# Patient Record
Sex: Female | Born: 1935 | Race: White | Hispanic: No | Smoking: Never smoker
Health system: Southern US, Community
[De-identification: ages and names within clinical notes are randomized; demographics above are authoritative.]

## PROBLEM LIST (undated history)

## (undated) DIAGNOSIS — R269 Unspecified abnormalities of gait and mobility: Secondary | ICD-10-CM

## (undated) DIAGNOSIS — C9110 Chronic lymphocytic leukemia of B-cell type not having achieved remission: Secondary | ICD-10-CM

## (undated) DIAGNOSIS — G629 Polyneuropathy, unspecified: Secondary | ICD-10-CM

## (undated) DIAGNOSIS — G20 Parkinson's disease: Secondary | ICD-10-CM

## (undated) DIAGNOSIS — E119 Type 2 diabetes mellitus without complications: Secondary | ICD-10-CM

## (undated) DIAGNOSIS — I10 Essential (primary) hypertension: Secondary | ICD-10-CM

## (undated) DIAGNOSIS — M48061 Spinal stenosis, lumbar region without neurogenic claudication: Secondary | ICD-10-CM

## (undated) HISTORY — PX: APPENDECTOMY: SHX54

---

## 1997-05-02 ENCOUNTER — Ambulatory Visit (HOSPITAL_COMMUNITY): Admission: RE | Admit: 1997-05-02 | Discharge: 1997-05-02 | Payer: Self-pay | Admitting: Family Medicine

## 1997-07-26 ENCOUNTER — Other Ambulatory Visit: Admission: RE | Admit: 1997-07-26 | Discharge: 1997-07-26 | Payer: Self-pay | Admitting: Critical Care Medicine

## 1998-02-24 ENCOUNTER — Ambulatory Visit (HOSPITAL_COMMUNITY): Admission: RE | Admit: 1998-02-24 | Discharge: 1998-02-24 | Payer: Self-pay | Admitting: Neurosurgery

## 1998-02-24 ENCOUNTER — Encounter: Payer: Self-pay | Admitting: Neurosurgery

## 1998-10-26 ENCOUNTER — Encounter: Admission: RE | Admit: 1998-10-26 | Discharge: 1999-01-24 | Payer: Self-pay | Admitting: Anesthesiology

## 1999-01-12 ENCOUNTER — Encounter: Payer: Self-pay | Admitting: Family Medicine

## 1999-01-12 ENCOUNTER — Ambulatory Visit (HOSPITAL_COMMUNITY): Admission: RE | Admit: 1999-01-12 | Discharge: 1999-01-12 | Payer: Self-pay | Admitting: Obstetrics and Gynecology

## 1999-01-23 ENCOUNTER — Encounter: Admission: RE | Admit: 1999-01-23 | Discharge: 1999-01-23 | Payer: Self-pay | Admitting: Oncology

## 1999-01-23 ENCOUNTER — Encounter: Payer: Self-pay | Admitting: Oncology

## 1999-02-05 ENCOUNTER — Ambulatory Visit (HOSPITAL_COMMUNITY): Admission: RE | Admit: 1999-02-05 | Discharge: 1999-02-05 | Payer: Self-pay | Admitting: Oncology

## 1999-02-05 ENCOUNTER — Encounter: Admission: RE | Admit: 1999-02-05 | Discharge: 1999-05-06 | Payer: Self-pay | Admitting: Anesthesiology

## 1999-04-08 ENCOUNTER — Encounter: Payer: Self-pay | Admitting: Neurosurgery

## 1999-04-08 ENCOUNTER — Ambulatory Visit (HOSPITAL_COMMUNITY): Admission: RE | Admit: 1999-04-08 | Discharge: 1999-04-08 | Payer: Self-pay | Admitting: Neurosurgery

## 1999-05-14 ENCOUNTER — Encounter: Admission: RE | Admit: 1999-05-14 | Discharge: 1999-08-12 | Payer: Self-pay | Admitting: Anesthesiology

## 2000-02-04 ENCOUNTER — Other Ambulatory Visit: Admission: RE | Admit: 2000-02-04 | Discharge: 2000-02-04 | Payer: Self-pay | Admitting: Family Medicine

## 2001-02-18 HISTORY — PX: COLONOSCOPY: SHX174

## 2002-01-11 ENCOUNTER — Ambulatory Visit (HOSPITAL_COMMUNITY): Admission: RE | Admit: 2002-01-11 | Discharge: 2002-01-11 | Payer: Self-pay | Admitting: Gastroenterology

## 2002-09-20 ENCOUNTER — Inpatient Hospital Stay (HOSPITAL_COMMUNITY): Admission: AD | Admit: 2002-09-20 | Discharge: 2002-09-25 | Payer: Self-pay | Admitting: Cardiology

## 2002-09-20 ENCOUNTER — Encounter: Payer: Self-pay | Admitting: Cardiology

## 2002-09-21 ENCOUNTER — Encounter: Payer: Self-pay | Admitting: Cardiology

## 2002-09-23 ENCOUNTER — Encounter (INDEPENDENT_AMBULATORY_CARE_PROVIDER_SITE_OTHER): Payer: Self-pay | Admitting: *Deleted

## 2003-05-03 ENCOUNTER — Other Ambulatory Visit: Admission: RE | Admit: 2003-05-03 | Discharge: 2003-05-03 | Payer: Self-pay | Admitting: Obstetrics and Gynecology

## 2003-07-20 HISTORY — PX: CHOLECYSTECTOMY: SHX55

## 2003-08-16 ENCOUNTER — Encounter (INDEPENDENT_AMBULATORY_CARE_PROVIDER_SITE_OTHER): Payer: Self-pay | Admitting: Specialist

## 2003-08-16 ENCOUNTER — Inpatient Hospital Stay (HOSPITAL_COMMUNITY): Admission: EM | Admit: 2003-08-16 | Discharge: 2003-08-19 | Payer: Self-pay | Admitting: Emergency Medicine

## 2003-08-16 HISTORY — PX: BONE MARROW BIOPSY: SHX1253

## 2003-12-07 ENCOUNTER — Ambulatory Visit (HOSPITAL_COMMUNITY): Admission: RE | Admit: 2003-12-07 | Discharge: 2003-12-07 | Payer: Self-pay | Admitting: Oncology

## 2003-12-22 ENCOUNTER — Ambulatory Visit: Payer: Self-pay | Admitting: Oncology

## 2004-02-07 ENCOUNTER — Ambulatory Visit: Payer: Self-pay | Admitting: Oncology

## 2004-08-06 ENCOUNTER — Inpatient Hospital Stay (HOSPITAL_COMMUNITY): Admission: EM | Admit: 2004-08-06 | Discharge: 2004-08-08 | Payer: Self-pay | Admitting: Emergency Medicine

## 2004-08-27 ENCOUNTER — Ambulatory Visit: Payer: Self-pay | Admitting: Oncology

## 2004-09-14 ENCOUNTER — Ambulatory Visit: Payer: Self-pay | Admitting: Critical Care Medicine

## 2004-10-04 ENCOUNTER — Ambulatory Visit: Payer: Self-pay | Admitting: Critical Care Medicine

## 2004-11-29 ENCOUNTER — Ambulatory Visit: Payer: Self-pay | Admitting: Critical Care Medicine

## 2005-01-29 ENCOUNTER — Ambulatory Visit: Payer: Self-pay | Admitting: Critical Care Medicine

## 2005-02-25 ENCOUNTER — Ambulatory Visit: Payer: Self-pay | Admitting: Oncology

## 2005-06-19 ENCOUNTER — Ambulatory Visit: Payer: Self-pay | Admitting: Critical Care Medicine

## 2005-08-19 ENCOUNTER — Ambulatory Visit: Payer: Self-pay | Admitting: Oncology

## 2005-08-27 LAB — CBC WITH DIFFERENTIAL/PLATELET
Basophils Absolute: 0 10*3/uL (ref 0.0–0.1)
HCT: 39.3 % (ref 34.8–46.6)
HGB: 13.7 g/dL (ref 11.6–15.9)
MONO#: 0.5 10*3/uL (ref 0.1–0.9)
NEUT#: 3.9 10*3/uL (ref 1.5–6.5)
NEUT%: 72.4 % (ref 39.6–76.8)
WBC: 5.4 10*3/uL (ref 3.9–10.0)
lymph#: 0.8 10*3/uL — ABNORMAL LOW (ref 0.9–3.3)

## 2005-08-29 LAB — COMPREHENSIVE METABOLIC PANEL
ALT: 14 U/L (ref 0–40)
Albumin: 4 g/dL (ref 3.5–5.2)
BUN: 16 mg/dL (ref 6–23)
CO2: 27 mEq/L (ref 19–32)
Calcium: 9 mg/dL (ref 8.4–10.5)
Chloride: 106 mEq/L (ref 96–112)
Creatinine, Ser: 0.79 mg/dL (ref 0.40–1.20)

## 2005-08-29 LAB — IMMUNOFIXATION ELECTROPHORESIS
IgA: 220 mg/dL (ref 68–378)
IgG (Immunoglobin G), Serum: 657 mg/dL — ABNORMAL LOW (ref 694–1618)

## 2005-08-29 LAB — BETA 2 MICROGLOBULIN, SERUM: Beta-2 Microglobulin: 2.55 mg/L — ABNORMAL HIGH (ref 1.01–1.73)

## 2005-09-24 LAB — CBC WITH DIFFERENTIAL/PLATELET
BASO%: 0.6 % (ref 0.0–2.0)
EOS%: 3.2 % (ref 0.0–7.0)
HGB: 13.7 g/dL (ref 11.6–15.9)
MCH: 32.2 pg (ref 26.0–34.0)
MCHC: 35.6 g/dL (ref 32.0–36.0)
MCV: 90.6 fL (ref 81.0–101.0)
MONO%: 10.3 % (ref 0.0–13.0)
RBC: 4.26 10*6/uL (ref 3.70–5.32)
RDW: 12.5 % (ref 11.3–14.5)
lymph#: 1.1 10*3/uL (ref 0.9–3.3)

## 2005-10-01 LAB — CBC WITH DIFFERENTIAL/PLATELET
Basophils Absolute: 0 10*3/uL (ref 0.0–0.1)
Eosinophils Absolute: 0.2 10*3/uL (ref 0.0–0.5)
HGB: 14.7 g/dL (ref 11.6–15.9)
MCV: 91.5 fL (ref 81.0–101.0)
MONO%: 10 % (ref 0.0–13.0)
NEUT#: 4 10*3/uL (ref 1.5–6.5)
RBC: 4.56 10*6/uL (ref 3.70–5.32)
RDW: 12.6 % (ref 11.3–14.5)
WBC: 6 10*3/uL (ref 3.9–10.0)
lymph#: 1.2 10*3/uL (ref 0.9–3.3)

## 2005-10-06 ENCOUNTER — Ambulatory Visit: Payer: Self-pay | Admitting: Oncology

## 2005-10-08 LAB — CBC WITH DIFFERENTIAL/PLATELET
BASO%: 0.9 % (ref 0.0–2.0)
Basophils Absolute: 0.1 10*3/uL (ref 0.0–0.1)
EOS%: 3.4 % (ref 0.0–7.0)
Eosinophils Absolute: 0.2 10*3/uL (ref 0.0–0.5)
HCT: 40.4 % (ref 34.8–46.6)
HGB: 14 g/dL (ref 11.6–15.9)
LYMPH%: 15.8 % (ref 14.0–48.0)
MCH: 32.2 pg (ref 26.0–34.0)
MCHC: 34.7 g/dL (ref 32.0–36.0)
MCV: 92.8 fL (ref 81.0–101.0)
MONO#: 0.6 10*3/uL (ref 0.1–0.9)
MONO%: 10.5 % (ref 0.0–13.0)
NEUT#: 4 10*3/uL (ref 1.5–6.5)
NEUT%: 69.4 % (ref 39.6–76.8)
Platelets: 163 10*3/uL (ref 145–400)
RBC: 4.36 10*6/uL (ref 3.70–5.32)
RDW: 12.7 % (ref 11.3–14.5)
WBC: 5.8 10*3/uL (ref 3.9–10.0)
lymph#: 0.9 10*3/uL (ref 0.9–3.3)

## 2005-10-15 LAB — CBC WITH DIFFERENTIAL/PLATELET
BASO%: 0.7 % (ref 0.0–2.0)
HCT: 42 % (ref 34.8–46.6)
MCHC: 34.1 g/dL (ref 32.0–36.0)
MONO#: 0.6 10*3/uL (ref 0.1–0.9)
RBC: 4.58 10*6/uL (ref 3.70–5.32)
WBC: 6.5 10*3/uL (ref 3.9–10.0)
lymph#: 1.1 10*3/uL (ref 0.9–3.3)

## 2005-11-19 ENCOUNTER — Ambulatory Visit: Payer: Self-pay | Admitting: Critical Care Medicine

## 2005-12-02 ENCOUNTER — Ambulatory Visit: Payer: Self-pay | Admitting: Oncology

## 2006-02-05 ENCOUNTER — Emergency Department (HOSPITAL_COMMUNITY): Admission: EM | Admit: 2006-02-05 | Discharge: 2006-02-05 | Payer: Self-pay | Admitting: Emergency Medicine

## 2006-02-27 ENCOUNTER — Ambulatory Visit: Payer: Self-pay | Admitting: Oncology

## 2006-03-04 LAB — CBC WITH DIFFERENTIAL/PLATELET
Basophils Absolute: 0 10*3/uL (ref 0.0–0.1)
Eosinophils Absolute: 0.1 10*3/uL (ref 0.0–0.5)
HCT: 40.8 % (ref 34.8–46.6)
HGB: 13.9 g/dL (ref 11.6–15.9)
LYMPH%: 14.7 % (ref 14.0–48.0)
MCV: 93.3 fL (ref 81.0–101.0)
MONO#: 0.5 10*3/uL (ref 0.1–0.9)
MONO%: 8 % (ref 0.0–13.0)
NEUT#: 4.9 10*3/uL (ref 1.5–6.5)
Platelets: 179 10*3/uL (ref 145–400)
WBC: 6.5 10*3/uL (ref 3.9–10.0)

## 2006-03-05 LAB — COMPREHENSIVE METABOLIC PANEL
Albumin: 4.2 g/dL (ref 3.5–5.2)
Alkaline Phosphatase: 110 U/L (ref 39–117)
BUN: 16 mg/dL (ref 6–23)
CO2: 27 mEq/L (ref 19–32)
Glucose, Bld: 142 mg/dL — ABNORMAL HIGH (ref 70–99)
Total Bilirubin: 0.7 mg/dL (ref 0.3–1.2)
Total Protein: 6.4 g/dL (ref 6.0–8.3)

## 2006-03-05 LAB — IGG, IGA, IGM
IgA: 211 mg/dL (ref 68–378)
IgG (Immunoglobin G), Serum: 586 mg/dL — ABNORMAL LOW (ref 694–1618)
IgM, Serum: 18 mg/dL — ABNORMAL LOW (ref 60–263)

## 2006-04-07 ENCOUNTER — Ambulatory Visit: Payer: Self-pay | Admitting: Oncology

## 2006-07-08 ENCOUNTER — Ambulatory Visit: Payer: Self-pay | Admitting: Oncology

## 2006-07-10 LAB — CBC WITH DIFFERENTIAL/PLATELET
BASO%: 0.2 % (ref 0.0–2.0)
Eosinophils Absolute: 0.1 10*3/uL (ref 0.0–0.5)
LYMPH%: 13.8 % — ABNORMAL LOW (ref 14.0–48.0)
MCHC: 34.9 g/dL (ref 32.0–36.0)
MCV: 92.1 fL (ref 81.0–101.0)
MONO%: 7.3 % (ref 0.0–13.0)
NEUT#: 6 10*3/uL (ref 1.5–6.5)
Platelets: 186 10*3/uL (ref 145–400)
RBC: 4.36 10*6/uL (ref 3.70–5.32)
RDW: 13 % (ref 11.3–14.5)
WBC: 7.7 10*3/uL (ref 3.9–10.0)

## 2006-09-02 ENCOUNTER — Emergency Department (HOSPITAL_COMMUNITY): Admission: EM | Admit: 2006-09-02 | Discharge: 2006-09-02 | Payer: Self-pay | Admitting: Emergency Medicine

## 2006-09-29 ENCOUNTER — Ambulatory Visit: Payer: Self-pay | Admitting: Oncology

## 2006-10-02 LAB — CBC & DIFF AND RETIC
BASO%: 0.2 % (ref 0.0–2.0)
Eosinophils Absolute: 0.2 10*3/uL (ref 0.0–0.5)
HCT: 40.1 % (ref 34.8–46.6)
IRF: 0.34 — ABNORMAL HIGH (ref 0.130–0.330)
MCHC: 35.5 g/dL (ref 32.0–36.0)
MONO#: 0.7 10*3/uL (ref 0.1–0.9)
NEUT#: 4.9 10*3/uL (ref 1.5–6.5)
NEUT%: 69.3 % (ref 39.6–76.8)
Platelets: 195 10*3/uL (ref 145–400)
RBC: 4.39 10*6/uL (ref 3.70–5.32)
Retic %: 2 % (ref 0.4–2.3)
WBC: 7.1 10*3/uL (ref 3.9–10.0)
lymph#: 1.3 10*3/uL (ref 0.9–3.3)

## 2006-10-02 LAB — CHCC SMEAR

## 2006-10-03 LAB — BETA 2 MICROGLOBULIN, SERUM: Beta-2 Microglobulin: 2.73 mg/L — ABNORMAL HIGH (ref 1.01–1.73)

## 2007-04-07 ENCOUNTER — Ambulatory Visit: Payer: Self-pay | Admitting: Oncology

## 2007-04-09 ENCOUNTER — Encounter: Payer: Self-pay | Admitting: Critical Care Medicine

## 2007-04-09 LAB — CHCC SMEAR

## 2007-04-09 LAB — CBC WITH DIFFERENTIAL/PLATELET
Basophils Absolute: 0 10*3/uL (ref 0.0–0.1)
EOS%: 1.9 % (ref 0.0–7.0)
Eosinophils Absolute: 0.2 10*3/uL (ref 0.0–0.5)
HGB: 14.4 g/dL (ref 11.6–15.9)
NEUT#: 6.1 10*3/uL (ref 1.5–6.5)
RBC: 4.47 10*6/uL (ref 3.70–5.32)
RDW: 13.3 % (ref 11.3–14.5)
lymph#: 1.6 10*3/uL (ref 0.9–3.3)

## 2007-04-09 LAB — MORPHOLOGY

## 2007-04-14 LAB — COMPREHENSIVE METABOLIC PANEL
Alkaline Phosphatase: 130 U/L — ABNORMAL HIGH (ref 39–117)
CO2: 28 mEq/L (ref 19–32)
Creatinine, Ser: 0.78 mg/dL (ref 0.40–1.20)
Glucose, Bld: 183 mg/dL — ABNORMAL HIGH (ref 70–99)
Sodium: 141 mEq/L (ref 135–145)
Total Bilirubin: 0.8 mg/dL (ref 0.3–1.2)
Total Protein: 6.5 g/dL (ref 6.0–8.3)

## 2007-04-14 LAB — IMMUNOFIXATION ELECTROPHORESIS
IgG (Immunoglobin G), Serum: 720 mg/dL (ref 694–1618)
IgM, Serum: 18 mg/dL — ABNORMAL LOW (ref 60–263)
Total Protein, Serum Electrophoresis: 6.5 g/dL (ref 6.0–8.3)

## 2007-04-14 LAB — BETA 2 MICROGLOBULIN, SERUM: Beta-2 Microglobulin: 2.63 mg/L — ABNORMAL HIGH (ref 1.01–1.73)

## 2007-10-23 ENCOUNTER — Ambulatory Visit: Payer: Self-pay | Admitting: Oncology

## 2007-10-29 ENCOUNTER — Encounter: Payer: Self-pay | Admitting: Critical Care Medicine

## 2007-10-29 LAB — CBC WITH DIFFERENTIAL/PLATELET
Eosinophils Absolute: 0.1 10*3/uL (ref 0.0–0.5)
LYMPH%: 12 % — ABNORMAL LOW (ref 14.0–48.0)
MONO#: 0.4 10*3/uL (ref 0.1–0.9)
NEUT#: 5.6 10*3/uL (ref 1.5–6.5)
Platelets: 160 10*3/uL (ref 145–400)
RBC: 4.26 10*6/uL (ref 3.70–5.32)
WBC: 7 10*3/uL (ref 3.9–10.0)
lymph#: 0.8 10*3/uL — ABNORMAL LOW (ref 0.9–3.3)

## 2007-10-29 LAB — MORPHOLOGY: PLT EST: ADEQUATE

## 2007-10-29 LAB — CHCC SMEAR

## 2007-11-02 LAB — IMMUNOFIXATION ELECTROPHORESIS: IgM, Serum: 15 mg/dL — ABNORMAL LOW (ref 60–263)

## 2007-11-02 LAB — COMPREHENSIVE METABOLIC PANEL
ALT: 12 U/L (ref 0–35)
AST: 18 U/L (ref 0–37)
Alkaline Phosphatase: 101 U/L (ref 39–117)
Creatinine, Ser: 0.81 mg/dL (ref 0.40–1.20)
Sodium: 139 mEq/L (ref 135–145)
Total Bilirubin: 0.8 mg/dL (ref 0.3–1.2)
Total Protein: 6.2 g/dL (ref 6.0–8.3)

## 2007-11-02 LAB — BETA 2 MICROGLOBULIN, SERUM: Beta-2 Microglobulin: 2.11 mg/L — ABNORMAL HIGH (ref 1.01–1.73)

## 2008-10-25 ENCOUNTER — Ambulatory Visit: Payer: Self-pay | Admitting: Oncology

## 2008-11-30 ENCOUNTER — Ambulatory Visit: Payer: Self-pay | Admitting: Oncology

## 2008-12-05 ENCOUNTER — Encounter: Payer: Self-pay | Admitting: Critical Care Medicine

## 2008-12-05 LAB — CBC WITH DIFFERENTIAL/PLATELET
BASO%: 0.1 % (ref 0.0–2.0)
EOS%: 1.4 % (ref 0.0–7.0)
MCH: 33 pg (ref 25.1–34.0)
MCHC: 34.9 g/dL (ref 31.5–36.0)
RDW: 13.4 % (ref 11.2–14.5)
lymph#: 0.9 10*3/uL (ref 0.9–3.3)

## 2008-12-05 LAB — COMPREHENSIVE METABOLIC PANEL
AST: 24 U/L (ref 0–37)
Albumin: 3.6 g/dL (ref 3.5–5.2)
Alkaline Phosphatase: 82 U/L (ref 39–117)
BUN: 13 mg/dL (ref 6–23)
Potassium: 4.4 mEq/L (ref 3.5–5.3)
Sodium: 138 mEq/L (ref 135–145)
Total Protein: 6.3 g/dL (ref 6.0–8.3)

## 2008-12-07 LAB — IMMUNOFIXATION ELECTROPHORESIS: IgA: 282 mg/dL (ref 68–378)

## 2009-02-16 ENCOUNTER — Encounter: Admission: RE | Admit: 2009-02-16 | Discharge: 2009-02-16 | Payer: Self-pay | Admitting: Gastroenterology

## 2009-02-21 ENCOUNTER — Encounter: Admission: RE | Admit: 2009-02-21 | Discharge: 2009-02-21 | Payer: Self-pay | Admitting: Gastroenterology

## 2009-07-15 ENCOUNTER — Emergency Department (HOSPITAL_COMMUNITY): Admission: EM | Admit: 2009-07-15 | Discharge: 2009-07-15 | Payer: Self-pay | Admitting: Emergency Medicine

## 2009-08-30 ENCOUNTER — Encounter: Admission: RE | Admit: 2009-08-30 | Discharge: 2009-08-30 | Payer: Self-pay | Admitting: Gastroenterology

## 2009-12-18 ENCOUNTER — Ambulatory Visit: Payer: Self-pay | Admitting: Oncology

## 2009-12-20 LAB — CBC WITH DIFFERENTIAL/PLATELET
BASO%: 0.1 % (ref 0.0–2.0)
Eosinophils Absolute: 0.1 10*3/uL (ref 0.0–0.5)
LYMPH%: 10.7 % — ABNORMAL LOW (ref 14.0–49.7)
MCHC: 34.9 g/dL (ref 31.5–36.0)
MCV: 93.7 fL (ref 79.5–101.0)
MONO%: 7.4 % (ref 0.0–14.0)
Platelets: 156 10*3/uL (ref 145–400)
RBC: 4.1 10*6/uL (ref 3.70–5.45)

## 2009-12-20 LAB — CHCC SMEAR

## 2009-12-22 LAB — COMPREHENSIVE METABOLIC PANEL
ALT: <8 U/L (ref 0–35)
BUN: 14 mg/dL (ref 6–23)
CO2: 27 mEq/L (ref 19–32)
Creatinine, Ser: 0.74 mg/dL (ref 0.40–1.20)
Total Bilirubin: 0.8 mg/dL (ref 0.3–1.2)

## 2009-12-22 LAB — IMMUNOFIXATION ELECTROPHORESIS
IgA: 271 mg/dL (ref 68–378)
IgG (Immunoglobin G), Serum: 681 mg/dL — ABNORMAL LOW (ref 694–1618)
IgM, Serum: 26 mg/dL — ABNORMAL LOW (ref 60–263)
Total Protein, Serum Electrophoresis: 6.1 g/dL (ref 6.0–8.3)

## 2010-03-20 LAB — BASIC METABOLIC PANEL
CO2: 30 mEq/L (ref 19–32)
Glucose, Bld: 91 mg/dL (ref 70–99)
Potassium: 4.9 mEq/L (ref 3.5–5.1)
Sodium: 136 mEq/L (ref 135–145)

## 2010-03-20 LAB — CBC
HCT: 40.3 % (ref 36.0–46.0)
Hemoglobin: 13.7 g/dL (ref 12.0–15.0)
WBC: 7.1 10*3/uL (ref 4.0–10.5)

## 2010-03-27 ENCOUNTER — Observation Stay (HOSPITAL_COMMUNITY)
Admission: RE | Admit: 2010-03-27 | Discharge: 2010-03-28 | Disposition: A | Discharge status: Home / Self Care | Payer: MEDICARE | Attending: Urology | Admitting: Urology

## 2010-03-27 ENCOUNTER — Other Ambulatory Visit: Payer: Self-pay | Admitting: Urology

## 2010-03-27 DIAGNOSIS — Z79899 Other long term (current) drug therapy: Secondary | ICD-10-CM | POA: Insufficient documentation

## 2010-03-27 DIAGNOSIS — Z01812 Encounter for preprocedural laboratory examination: Secondary | ICD-10-CM | POA: Insufficient documentation

## 2010-03-27 DIAGNOSIS — D494 Neoplasm of unspecified behavior of bladder: Principal | ICD-10-CM | POA: Insufficient documentation

## 2010-03-27 DIAGNOSIS — G20A1 Parkinson's disease without dyskinesia, without mention of fluctuations: Secondary | ICD-10-CM | POA: Insufficient documentation

## 2010-03-27 DIAGNOSIS — N3020 Other chronic cystitis without hematuria: Secondary | ICD-10-CM | POA: Insufficient documentation

## 2010-03-27 DIAGNOSIS — G20 Parkinson's disease: Secondary | ICD-10-CM | POA: Insufficient documentation

## 2010-03-27 DIAGNOSIS — C9110 Chronic lymphocytic leukemia of B-cell type not having achieved remission: Secondary | ICD-10-CM | POA: Insufficient documentation

## 2010-03-27 LAB — GLUCOSE, CAPILLARY
Glucose-Capillary: 127 mg/dL — ABNORMAL HIGH (ref 70–99)
Glucose-Capillary: 215 mg/dL — ABNORMAL HIGH (ref 70–99)

## 2010-03-29 NOTE — Op Note (Signed)
  Vanessa Carroll, HAMME                  ACCOUNT NO.:  [id number]  MEDICAL RECORD NO.:  [id number]           PATIENT TYPE:  O  LOCATION:  DAYL                         FACILITY:  [facility]  PHYSICIAN:  Courtney Paris, M.D.DATE OF BIRTH:  1920-10-11  DATE OF PROCEDURE:  03/27/2010 DATE OF DISCHARGE:                              OPERATIVE REPORT   PREOPERATIVE DIAGNOSIS:  Papillary bladder tumor left trigone (TA).  POSTOPERATIVE DIAGNOSIS:  Papillary bladder tumor left trigone (TA).  ANESTHESIA:  General.  SURGEON:  Courtney Paris, M.D.  BRIEF HISTORY:  This 77 year old patient with chronic abdominal pain was found to have a bladder tumor on the trigone on routine office cystoscopy.  She has not had previous UTIs for years.  She does have chronic lymphocytic leukemia and Parkinson's disease.  She enters now for resection of this small lesion that was found on the trigone near the left orifice.  Upper tracts were normal.  PROCEDURE IN DETAIL:  The patient was placed on the operating table in the dorsal lithotomy position after satisfactory induction of general anesthesia and was prepped and draped with Betadine in the usual sterile fashion.  Time-out was then performed and the patient and the procedure were then reconfirmed.  She was given IV Cipro.  A 21 panendoscope was inserted and the bladder was carefully inspected.  The tumor on the left trigone was photographed and then was biopsied in three bites with the biopsy forceps.  A picture was taken and then also of the fulgurated area at the end of the procedure.  Hemostasis was quite good.  I did not see the need to leave a Foley catheter.  The bladder was drained, scope removed and the patient taken to the recovery room in good condition. Because of her age and the fact that she lives alone, she will be kept overnight for extended observation.     Courtney Paris, M.D.     HMK/MEDQ  D:  03/27/2010  T:   03/27/2010  Job:  [id number]  Electronically Signed by Vic Blackbird M.D. on 03/29/2010 12:57:34 PM

## 2010-05-07 LAB — DIFFERENTIAL
Basophils Absolute: 0 10*3/uL (ref 0.0–0.1)
Basophils Relative: 0 % (ref 0–1)
Neutro Abs: 6.7 10*3/uL (ref 1.7–7.7)
Neutrophils Relative %: 73 % (ref 43–77)

## 2010-05-07 LAB — COMPREHENSIVE METABOLIC PANEL
Alkaline Phosphatase: 88 U/L (ref 39–117)
BUN: 11 mg/dL (ref 6–23)
Chloride: 101 mEq/L (ref 96–112)
Glucose, Bld: 61 mg/dL — ABNORMAL LOW (ref 70–99)
Potassium: 3.9 mEq/L (ref 3.5–5.1)
Total Bilirubin: 1 mg/dL (ref 0.3–1.2)
Total Protein: 6.8 g/dL (ref 6.0–8.3)

## 2010-05-07 LAB — URINALYSIS, ROUTINE W REFLEX MICROSCOPIC
Bilirubin Urine: NEGATIVE
Hgb urine dipstick: NEGATIVE
Ketones, ur: NEGATIVE mg/dL
Protein, ur: NEGATIVE mg/dL
Urobilinogen, UA: 1 mg/dL (ref 0.0–1.0)

## 2010-05-07 LAB — GLUCOSE, CAPILLARY: Glucose-Capillary: 119 mg/dL — ABNORMAL HIGH (ref 70–99)

## 2010-05-07 LAB — CBC
HCT: 42.5 % (ref 36.0–46.0)
Hemoglobin: 14.2 g/dL (ref 12.0–15.0)
MCV: 97 fL (ref 78.0–100.0)
RDW: 12.7 % (ref 11.5–15.5)

## 2010-07-05 NOTE — Discharge Summary (Signed)
  NAMEPADEN, KURAS                  ACCOUNT NO.:  [id number]  MEDICAL RECORD NO.:  [id number]           PATIENT TYPE:  I  LOCATION:  1415                         FACILITY:  [facility]  PHYSICIAN:  Courtney Paris, M.D.DATE OF BIRTH:  08/24/1920  DATE OF ADMISSION:  03/27/2010 DATE OF DISCHARGE:  03/28/2010                              DISCHARGE SUMMARY   Discharged on March 28, 2010, observation status.  DISCHARGE DIAGNOSES: 1. Papillary tumor, left trigone, pending path report. 2. Chronic lymphocytic leukemia. 3. Parkinson's disease.  OPERATIONS AND PROCEDURES:  Transurethral resection of papillary bladder tumor, left trigone, on March 27, 2010.  BRIEF HISTORY:  This 77 year old patient with chronic abdominal pain was found to have a bladder tumor on the trigone on routine office cystoscopy.  She has not had previous UTIs for years.  She does have chronic lymphocytic leukemia, Parkinson's disease, and she enters now for resection of this small lesion that was found on the trigone near the left orifice on office cystoscopy.  The upper tracts were normal.  She had a TUR of this small papillary tumor under general anesthesia on the day of admission and was kept overnight just because of her age and the fact that she lives alone.  She had a catheter and this had to be irrigated a couple times but otherwise was generally clear.  She was sent home the following day in good ambulatory and satisfactory condition on a regular diet and on the previous home medications.     Courtney Paris, M.D.     HMK/MEDQ  D:  06/14/2010  T:  06/14/2010  Job:  [id number]  Electronically Signed by Vic Blackbird M.D. on 07/05/2010 05:19:44 PM

## 2010-07-06 NOTE — H&P (Signed)
[name], [name]                            ACCOUNT NO.:  [id number]   MEDICAL RECORD NO.:  [id number]                   PATIENT TYPE:  EMS   LOCATION:  MAJO                                 FACILITY:  [facility]   PHYSICIAN:  Sharlet Salina T. Hoxworth, M.D.          DATE OF BIRTH:  1920-12-12   DATE OF ADMISSION:  08/16/2003  DATE OF DISCHARGE:                                HISTORY & PHYSICAL   CHIEF COMPLAINT:  Abdominal pain.   HISTORY OF PRESENT ILLNESS:  Vanessa Carroll is an 77 year old white female who  was awakened from sleep last night with severe epigastric and right upper  quadrant abdominal pain.  The severe pain lasted for several hours and she  presented to the [facility] Emergency Room.  The pain has subsided somewhat and  now is milder but is persistent.  She had no associated nausea and vomiting.  No fever or chills.  She has had some intermittent similar but much milder  pain over the last several weeks.  She also complains of just not feeling  well for several months.  She has had normal bowel movements.  No melena or  hematochezia.   PAST MEDICAL HISTORY:   SURGICAL HISTORY:  1. Surgical history significant for remote hysterectomy, appendectomy and     foot surgery.  2. She also has a recent biopsy of a skin cancer on the right lower     extremity, histology unknown at this point.   MEDICAL:  1. She is followed by Dr. Vianne Bulls.  She is treated for chronic anemia     of uncertain etiology.  2. Degenerative joint disease.  3. Hypertension.  4. Non-insulin-dependent diabetes mellitus.  5. Peripheral neuropathy.  6. Chronic bronchitis.  7. Hypothyroidism.  8. She had an admission in 2004 for shortness of breath, had a normal     cardiac catheterization and echo.  9. She had a negative colonoscopy in 2003.  10.      Had a negative EGD in 2004 by Dr. Fayrene Fearing L. Edwards.   CURRENT MEDICATIONS:  1. Clonidine 0.1 mg daily.  2. Neurontin 300 mg b.i.d.  3. Diovan 160 mg  daily.  4. Atenolol 50 mg daily.  5. Synthroid 100 mcg Monday/Wednesday/Friday.  6. Demadex 10 mg a day.  7. Avandia 2 mg a day.  8. Klor-Con 1 daily.  9. Folic acid and iron supplements daily.  10.      B12 injections monthly.   ALLERGIES:  She is allergic to SULFA ANTIBIOTICS and CODEINE.   SOCIAL HISTORY:  She is widowed, lives at [facility], accompanied by  her son, does not smoke cigarettes or drink alcohol.   FAMILY HISTORY:  Mother died of renal failure.  Father died of heart  disease.   REVIEW OF SYSTEMS:  GENERAL:  Positive for recent fatigue and some weakness.  HEENT:  Positive for decreasing vision.  RESPIRATORY:  Positive for  occasional shortness of breath at rest or exertion and occasional cough.  CARDIAC:  Denies chest pain, palpitations.  GI:  Positive GI as above.  ORTHOPEDICS:  Positive ortho for chronic back and joint pain.  NEUROLOGIC:  Positive neurologic for numbness and pain in her legs secondary to  neuropathy.   PHYSICAL EXAM:  VITAL SIGNS:  Temperature is 98.1, pulse 78 and regular,  respirations 20, blood pressure 157/59.  GENERAL:  In general, she is an elderly white female, alert, in no acute  distress.  SKIN:  Skin warm and dry.  There is about a 2-cm slightly raised, red, scaly  lesion on the left lower extremity just above the ankle.  No other lesions,  rash or infection.  LYMPH NODES:  No cervical, supraclavicular, axillary or inguinal nodes  palpable.  HEENT:  No palpable thyromegaly.  There is mild scleral icterus.  Nares and  oropharynx clear.  LUNGS:  Lungs clear to auscultation without increased work of breathing.  CARDIAC:  Regular rate and rhythm without murmurs.  Peripheral edema 1+.  Peripheral pulses intact.  ABDOMEN:  Mild obesity.  There is well-localized epigastric and right upper  quadrant tenderness with some guarding.  No palpable masses or  hepatosplenomegaly.  EXTREMITIES:  No joint swelling or deformity.   NEUROLOGIC:  She is alert and fully oriented.  Mental status exam is grossly  normal.   LABORATORY:  White count elevated at 15,300, hemoglobin 9.2, platelet count  211,000.  Urinalysis:  Positive urobilinogen.  Electrolytes unremarkable.  Glucose 159.  LFTs abnormal for an SGOT of 67, SGPT normal at 28, alkaline  phosphatase elevated at 169, bilirubin elevated at 3.7, amylase 22, lipase  25.   EKG shows no acute changes with some left axis deviation.   CK-MB and troponins are negative.   Chest x-ray shows no acute disease.   Ultrasound of the gallbladder reveals a thickened gallbladder wall, small  gallstones, pericholecystic fluid and normal common bile duct.   ASSESSMENT AND PLAN:  1. Acute abdominal pain and tenderness consistent with acute cholecystitis.     She has elevated LFTs which likely could be secondary to inflammatory     change around the gallbladder or possibly common bile duct stone.  2. Non-insulin-dependent diabetes mellitus.  3. Chronic anemia, uncertain etiology.  4. Hypertension.   PLAN:  The patient will be admitted for pain control and IV antibiotics.  We  will plan early laparoscopic cholecystectomy with cholangiogram.  I have  discussed this with Dr. Althea Grimmer. Santogade, who agrees that initial approach  with surgery and careful cholangiogram would be appropriate and then ERCP  reserved should stones be found.  This was discussed with the patient and  her son.                                                Lorne Skeens. Hoxworth, M.D.    Tory Emerald  D:  08/16/2003  T:  08/16/2003  Job:  [id number]

## 2010-07-06 NOTE — H&P (Signed)
NAMEBEDA, DULA NO.:  [id number]   MEDICAL RECORD NO.:  [id number]          PATIENT TYPE:  EMS   LOCATION:  ED                           FACILITY:  [facility]   PHYSICIAN:  Sherin Quarry, MD      DATE OF BIRTH:  1920-06-16   DATE OF ADMISSION:  08/06/2004  DATE OF DISCHARGE:                                HISTORY & PHYSICAL   HISTORY OF PRESENT ILLNESS:  Vanessa Carroll is a very pleasant 77 year old lady  who is a resident of the [facility]. Ms. Mazzarella states that for the last  week she has been experiencing persistent cough productive of dark phlegm  associated with low-grade fever, malaise, and marked shortness of breath  with exertion. She has noticed chest tightness and wheezing. She was  evaluated today at Dr. Recardo Evangelist office and was advised to come to the  emergency room for further treatment. She states that she has not been  having any chest pain, there has been no nausea or vomiting, no abdominal  pain. Her bowels have been slightly loose. She says that she has a past  history of recurrent bronchitis and has been treated by Dr. Delford Field in the  past for this. The last bad episode she had was about 4 years ago but she  was not hospitalized at that time. Also of significance is that 2 years ago  the patient was hospitalized under Dr. Ronnald Nian care. During the course of  that hospitalization she had an echocardiogram which was normal. She also  had a cardiac catheterization which showed normal coronary vessels with  ejection fraction of 60%.   PAST MEDICAL HISTORY:  Medications currently consist of:  1.  Clonidine 0.1 mg b.i.d.  2.  Neurontin 300 mg t.i.d.  3.  Diovan 160 mg daily  4.  Inderal 60 mg daily.  5.  Synthroid 100 mcg three times weekly.  6.  Demodex 10 mg daily.  7.  Avandia 2 mg daily,  8.  K-Lor one tablet daily.   She is allergic to SULFA DRUGS and CODEINE.   OPERATIONS:  She had a cholecystectomy in June 2005.  She is also had a   hysterectomy, appendectomy, a foot operation, as well as a biopsy of a skin  cancer on the right lower extremity.   MEDICAL ILLNESSES:  1.  The patient has been diagnosed with chronic lymphocytic leukemia. I do      not believe she is currently on any treatment for this problem.  2.  Degenerative joint disease.  3.  Hypertension.  4.  History of non-insulin dependent diabetes.  5.  Peripheral neuropathy.  6.  Chronic bronchitis as described above.  7.  Hypothyroidism.   FAMILY HISTORY:  Her mother died of renal failure. Father died of heart  disease. She has three elderly siblings, none of whom have any definable  illnesses except old age as far as she knows.   SOCIAL HISTORY:  As previously mentioned, she lives at [facility]. She  does not smoke or consume alcohol.   REVIEW OF SYSTEMS:  HEAD:  She denies headache or dizziness. EYES:  She  denies visual blurring or diplopia. EAR, NOSE, AND THROAT:  Denies earache,  sinus pain or sore throat. CHEST:  See above. CARDIOVASCULAR:  The patient  is somewhat more uncomfortable when she is lying down flat. She has not been  having any PND although she is having difficulty sleeping because of her  shortness of breath. She has not had any chest pain. She has chronic ankle  edema but does not think it is a worsened usual GI:  She denies nausea,  vomiting or abdominal pain. GU:  Denies dysuria or urinary frequency. NEURO:  No history of seizure or stroke. ENDO:  See above.   PHYSICAL EXAMINATION:  GENERAL:  She is a very pleasant cooperative lady who  is not in any severe distress but does have a chronic cough and wheeze.  HEENT:  Exam is within normal limits.  CHEST:  Remarkable for diminished breath sounds diffusely. There is moderate  to severe expiratory wheezing particularly notable with effort. A few rales  are noted at the bases.  CARDIOVASCULAR:  Revealed normal S1 and S2 without rubs, murmurs or gallops.  ABDOMEN:  Benign. There  are normal bowel sounds without masses, tenderness,  organomegaly.  NEUROLOGIC TESTING:  Within normal limits.  EXTREMITIES:  Revealed 2+ pretibial edema.   I do not have any laboratory studies back as yet. I reviewed her chest x-ray  which looks to me like as she has slightly worsening cardiomegaly. There is  also an area that looks like atelectasis or infiltrate in the left mid lung  field. There is no definite pneumonia or congestive heart failure.   IMPRESSION:  1.  Bronchitis.  2.  Need to rule out congestive heart failure.  3.  Hypertension.  4.  History of non-insulin-dependent diabetes.  5.  Hypothyroidism.  6.  History of normal catheterization and echocardiogram 2004.  7.  Status post hysterectomy, appendectomy, foot operation, and      cholecystectomy.  8.  Chronic lymphocytic leukemia.   PLAN:  The patient will be admitted to the hospital. Will check a BNP and 2-  D echocardiogram to try to make sure that her cardiac function remains  normal. Will continue her usual treatment and give her some supplemental  Lasix. Will start her on IV Avelox as well as nebulizer treatments with  albuterol and Atrovent, and will initiate some Solu-Medrol therapy. In light  of the use of steroids, will also place her on a sliding scale insulin  regimen.       SY/MEDQ  D:  08/06/2004  T:  08/06/2004  Job:  [id number]   cc:   Chales Salmon. Abigail Miyamoto, M.D.  [address]  [place]  [place] [address]  Fax: [phone]   Colleen Can. Deborah Chalk, M.D.  Fax: [phone]

## 2010-07-06 NOTE — Discharge Summary (Signed)
NAMEZEPHYRA, BERNARDI                            ACCOUNT NO.:  [id number]   MEDICAL RECORD NO.:  [id number]                   PATIENT TYPE:  INP   LOCATION:  2011                                 FACILITY:  [facility]   PHYSICIAN:  Colleen Can. Deborah Chalk, M.D.            DATE OF BIRTH:  05-Jan-1921   DATE OF ADMISSION:  09/20/2002  DATE OF DISCHARGE:  09/25/2002                                 DISCHARGE SUMMARY   PRIMARY DISCHARGE DIAGNOSES:  1. Shortness of breath.  2. Vague substernal chest pain with subsequent elective cardiac     catheterization revealing normal left ventricular function, normal left     main, normal left anterior descending, normal left circumflex, and normal     right coronary artery.   SECONDARY DISCHARGE DIAGNOSES:  1. Hypertensive heart disease.  2. Anemia, subsequently requiring transfusion of a total of 2 units of     packed red blood cells with subsequent esophagogastroduodenoscopy per Dr.     Vilinda Boehringer revealing gastritis and gastroparesis, questionably secondary     to diabetes.  3. B12 deficiency.  4. History of goiter, currently on Synthroid replacement.  5. Peripheral neuropathy.  6. Non-insulin dependent diabetes.  7. Chronic low back pain with generalized arthritis.   HISTORY OF PRESENT ILLNESS:  The patient is an 77 year old pleasant white  female who has multiple medical problems.  She presents from her primary  care Jaquaya Coyle's office with a six to eight day history of increasing ankle  edema, shortness of breath, increasing weight, and inability to breathe.  She has also had a vague episode of substernal chest pain.  She was  subsequently seen and evaluated and referred for admission.   Please see dictated history and physical per Dr. Roger Shelter for further  patient presentation and profile.   LABORATORY DATA:  On admission, hematocrit was 26, white count 10,000.  Chemistries were normal.  Troponin and CKs were negative.  B-peptide was  201, down to 167.   PROCEDURES PERFORMED:  1. A 2-D echocardiogram revealing ejection fraction of 55 to 65% with mild     dilatation of the left atrium.  The left ventricular wall thickness was     mildly increased.  2. Cardiac catheterization with normal findings.   HOSPITAL COURSE:  The patient was admitted.  She was placed on her home  medicines.  It was felt that she would best need to proceed on with cardiac  catheterization.  She was noted to be quite anemic.  Her stools were guaiac  negative.  She subsequently required transfusion of a total of 2 units of  packed red blood cells, but we were able to proceed on with cardiac  catheterization on September 22, 2002.  Those results are as noted above.  It is  felt the patient suffers primarily from hypertensive heart disease, and that  her current problems are probably related to the  anemia.  Subsequent  evaluation was under way.   GI was called to see the patient on September 23, 2002, and subsequently  proceeded on with EGD.  The findings revealed gastroparesis, questionably  secondary to diabetes as well as gastritis, and it was his recommendations  to treat the patient's anemia with B12 injections.  She is to follow up with  Dr. Madilyn Fireman in three to four weeks, to repeat the EGD after two to three days  of clear liquids, and she is to be placed on Reglan.   From a cardiac standpoint, the patient has done well.  She was diuresed.  Her anemia was treated with transfusion, and she is felt to be a stable  candidate for discharge on September 25, 2002.   CONDITION ON DISCHARGE:  Stable.   DISCHARGE MEDICATIONS:  1. Reglan 10 mg q.i.d.  2. Neurontin 300 mg daily.  3. Diovan 160 mg daily.  4. Avandia 2 mg daily.  5. Synthroid 0.1 daily.  6. Clonidine 0.1 mg b.i.d.  7. Gemfibrozil 600 mg b.i.d.  8. Tenormin 50 mg daily.  9. Tylenol instead of Bextra.  She is no longer to use non-steroidal's.  10.      We will change her Demodex over to Lasix  40 mg daily.  11.      Klotrix 10 mEq daily.   FOLLOWUP:  1. She is to see her primary care for followup next week in order to have     B12 injections continued.  2. We will have her see Dr. Madilyn Fireman in approximately three to four weeks.  She     is to have a repeat EGD after having two to three days of clear liquids.  3. She will see Dr. Ronnald Nian nurse practitioner in approximately four to     six weeks or sooner if problems arise.      Juanell Fairly C. Earl Gala, N.P.                 Colleen Can. Deborah Chalk, M.D.    LCO/MEDQ  D:  09/24/2002  T:  09/25/2002  Job:  [id number]   cc:   [facility]   Everardo All. Madilyn Fireman, M.D.  [address]  [place]  [place] [address]  Fax: [phone]

## 2010-07-06 NOTE — Op Note (Signed)
   Vanessa Carroll, Vanessa Carroll                            ACCOUNT NO.:  [id number]   MEDICAL RECORD NO.:  [id number]                   PATIENT TYPE:  INP   LOCATION:  2011                                 FACILITY:  [facility]   PHYSICIAN:  James L. Malon Kindle., M.D.          DATE OF BIRTH:  1920/05/29   DATE OF PROCEDURE:  09/24/2002  DATE OF DISCHARGE:                                 OPERATIVE REPORT   PROCEDURE:  Esophagogastroduodenoscopy and biopsy.   MEDICATIONS:  Cetacaine spray, fentanyl 40 mcg, Versed 4 mg IV.   INDICATIONS:  Anemia with low B12 level, previous colonoscopy by Dr. Madilyn Fireman  negative.   DESCRIPTION OF PROCEDURE:  The procedure had been explained to the patient  and consent obtained.  With the patient in the left lateral decubitus  position, the scope was inserted and advanced.  The stomach was entered,  pylorus identified and passed.  On entering the stomach, the patient was  seen to have a large amount of retained food material that obscured  approximately half of the greater curve despite an overnight fast.  The  pyloric channel was widely patent and way down in the second duodenum, the  duodenum was normal from the second duodenum and the duodenal bulb.  The  pyloric channel appears widely patent and normal.  The antrum is free of  ulcerations.  It was reddened and inflamed, and this was true throughout all  the mucosa that we could see.  Approximately 50% or more was obscured with  food material.  Multiple random biopsies were taken to look for evidence of  chronic gastritis.  The scope was withdrawn, and the distal and proximal  esophagus were normal.  The patient tolerated the procedure well and was  maintained on low-flow oxygen and pulse oximetry.  There were no  intraoperative complications.   ASSESSMENT:  1. Probable gastroparesis.  2. Probable gastritis, may be related to B12 deficiency.   PLAN:  Will treat anemia with B12 injections, have the patient follow  up  with Dr. Madilyn Fireman in three four weeks.  We will need a repeat endoscopy after  several days of clear liquids but go ahead and send home on Reglan to  improve the gastroparesis.                                               James L. Malon Kindle., M.D.    Waldron Session  D:  09/24/2002  T:  09/24/2002  Job:  [id number]   cc:   Colleen Can. Deborah Chalk, M.D.  [address]  [place]  [place] [address]  Fax: [phone]

## 2010-07-06 NOTE — Discharge Summary (Signed)
NAMEHIILEI, GERST                            ACCOUNT NO.:  [id number]   MEDICAL RECORD NO.:  [id number]                   PATIENT TYPE:  INP   LOCATION:  5725                                 FACILITY:  [facility]   PHYSICIAN:  Sharlet Salina T. Hoxworth, M.D.          DATE OF BIRTH:  12-07-1920   DATE OF ADMISSION:  08/16/2003  DATE OF DISCHARGE:  08/19/2003                                 DISCHARGE SUMMARY   DISCHARGE DIAGNOSES:  1. Cholelithiasis and cholecystitis.  2. Elevated liver function tests, possible cholangitis.  3. Postoperative atelectasis and hypoxemia.   HISTORY OF PRESENT ILLNESS:  Ms. Contino is an 77 year old white female  awakened from sleep the night before admission with severe epigastric and  right upper quadrant abdominal pain.  She presented to the emergency room.  The pain is somewhat milder at the time of admission but is persistent.  There is no associated nausea and vomiting, no fever or chills.  She also  complains of just not feeling well for several months.  She has had normal  bowel movements.  No melena or hematochezia.   PAST SURGICAL HISTORY:  1. Remote hysterectomy.  2. Appendectomy.  3. Foot surgery.  4. Recent biopsy of skin cancer in right lower extremity, histology     unavailable at this point.   PAST MEDICAL HISTORY:  1. She is followed by Dr. Vianne Bulls.  She has chronic anemia of uncertain     etiology.  2. Degenerative joint disease.  3. Hypertension.  4. Non-insulin-dependent diabetes mellitus.  5. Peripheral neuropathy.  6. Chronic bronchitis.  7. Hypothyroidism.  8. She had admission in 2004 for shortness of breath with normal cardiac     cath and echo at that time.  9. Negative colonoscopy in 2003.  10.      Negative EGD in 2004.   MEDICATIONS ON ADMISSION:  1. Clonidine 0.1 mg daily.  2. Neurontin 300 mg b.i.d.  3. Diovan 160 mg daily.  4. Atenolol 50 mg daily.  5. Synthroid 100 mcg Monday, Wednesday, and Friday.  6. Demadex  10 mg a day.  7. Avandia 2 mg daily.  8. Klor-Con one daily.  9. Folic acid and iron supplements daily.  10.      B12 injections monthly.   SHE IS ALLERGIC TO SULFA ANTIBIOTICS AND CODEINE.   SOCIAL HISTORY:  Widowed.  [place]  No cigarettes or  alcohol.   FAMILY HISTORY:  Significant for renal failure in mother and heart disease  in father.   REVIEW OF SYSTEMS:  GENERAL:  Positive for recent fatigue and weakness.  RESPIRATORY:  Positive for occasional shortness of breath at rest and  exertion and occasional cough.  ORTHO:  Positive for chronic joint and back  pain.  NEUROLOGIC:  Positive for numbness and pain in the legs secondary to  neuropathy.   PHYSICAL EXAMINATION:  VITAL  SIGNS:  Temperature 98, pulse 78 and regular,  respirations 20, blood pressure 177/59.  GENERAL:  Elderly white female, mildly obese, alert, in no acute distress.  CHEST:  Clear without increased work of breathing.  SKIN:  A 2-cm slightly raised red scaly lesion on the right lower extremity  with a central biopsy site.  LYMPH NODES:  None palpable.  CARDIAC:  Regular rate and rhythm without murmurs.  Peripheral edema 1+.  ABDOMEN:  Mild obesity.  Well localized epigastric and right upper quadrant  abdominal tenderness with guarding.  EXTREMITIES:  No joint swelling or deformity.  NEUROLOGIC:  Alert and fully oriented.  Motor and sensory exams grossly  normal.   ADMISSION LABORATORY:  White count elevated at 15,300.  Hemoglobin 9.2,  platelet count 211.  Urinalysis significant only for positive bilirubin.  Glucose 153.  LFTs abnormal for an SGOT of 67 and SGPT of 28.  Alkaline  phosphatase is elevated at 169.  Bilirubin elevated at 3.7.  Amylase and  lipase normal.  EKG with no acute changes.  Some left axis deviation.  CK-MB  and troponins were negative.  Chest x-ray showed no acute disease.  Ultrasound of the gallbladder revealed thickened gallbladder wall, small  gallstones,  and pericholecystic fluid consistent with acute cholecystitis.  Bile duct appeared normal.   HOSPITAL COURSE:  Patient was felt to have acute cholecystitis.  She was  given preoperative IV antibiotics and taken on the day of admission to the  operating room.  She underwent a laparoscopic cholecystectomy with findings  of a mildly acutely inflamed and edematous gallbladder.  Operative  cholangiogram was normal.  The liver appeared mildly enlarged and nodular  and a core needle biopsy was obtained.   Postoperatively, on the first postop day she had stable vital signs although  felt very weak.  She had some moderate upper abdominal pain.  Abdomen was  mildly tender.  Laboratory at this time showed a hemoglobin of 10.9 and  white count up to 29,000.  Electrolytes were normal.  LFTs were improved  with a bilirubin of 2.2.  Alkaline phosphatase of 162.  She was requiring  oxygen for some shortness of breath.  Chest x-ray was obtained which  revealed lower lobe bilateral atelectasis without any evidence of heart  failure.  At this point, her antibiotics were broadened to Maxipime IV due  to the elevated white count and the possibility of some lower lobe  pneumonia, although her chest x-ray really looks more like atelectasis.  On  June 30, she still was complaining of some weakness and some shortness of  breath.  She was in no respiratory distress.  She still required oxygen.  White count was down to 22,000 and hemoglobin was 9.2.  Electrolytes  remained normal, although BUN was 41, and LFTs continued to improve.  She  was continued on IV antibiotics.  ABGs did show a low PO2 in the 40s on room  air.  She was continued on oxygen pulmonary toilet.  By July 1 she was  feeling better.  Still had some weakness but less shortness of breath and  less abdominal discomfort.  Abdomen was relatively benign.  Laboratory evaluation at this time shows white count decreased to 18,000 and hemoglobin  8.2,  which is near her baseline.  LFTs continue to improve.  Bilirubin 1.7,  transaminase 45, alkaline phosphatase normal.  Her core needle biopsy  returned showing mild nonspecific chronic hepatitis and some evidence of  acute cholangitis.  During the course of the day on July 1 she clinically improved.  She was up  in the hall ambulatory and had normal O2 sats of 92% on room air.  She was  tolerating fluids well.  At this point, she was felt to be stable to return  to friend's home skilled nursing facility.  She will be converted to oral  antibiotics, p.o. Levaquin, for the next week to finish treatment for acute  cholecystitis and possible cholangitis and to cover for possible respiratory  infection, although it appears she has just resolving atelectasis.  Other  medications are unchanged from admission.  She was encouraged to increase  activity.  Followup is to be in my office in 10-14 days.                                                Lorne Skeens. Hoxworth, M.D.    Tory Emerald  D:  08/19/2003  T:  08/19/2003  Job:  [id number]

## 2010-07-06 NOTE — H&P (Signed)
NAMEJAEL, WALDORF                            ACCOUNT NO.:  [id number]   MEDICAL RECORD NO.:  [id number]                   PATIENT TYPE:  INP   LOCATION:  2929                                 FACILITY:  [facility]   PHYSICIAN:  Colleen Can. Deborah Chalk, M.D.            DATE OF BIRTH:  1920/04/20   DATE OF ADMISSION:  09/20/2002  DATE OF DISCHARGE:                                HISTORY & PHYSICAL   HISTORY:  Vanessa Carroll is an 77 year old female.  Over the last six to eight  days, she has noted increasing ankle edema, more shortness of breath,  increasing weight, and unable to breath.  She has had vague substernal chest  tightness.  She has been able to sleep flat, but at first when she lies  down, she will be short of breath.  She is complaining of marked dyspnea on  exertion.  Yesterday she felt chest tightness and a marked tightness that  persisted most of the day.  She has increased her Demadex recently.   ALLERGIES:  1. SULFA.  2. CODEINE.   CURRENT MEDICATIONS:  1. Klotrix 10 mEq a day.  2. Diovan 160 mg per day.  3. Synthroid 0.1 mg a day.  4. Clonidine 0.1 mg b.i.d.  5. Gemfibrozil 600 mg b.i.d.  6. Glucosamine 200 mg per day.  7. Tenormin 50 mg a day.  8. Demadex 10 mg a day.  9. Avandia 2 mg a day.  10.      Neurontin 300 mg b.i.d.  11.      Bextra 10 mg per day.   PAST SURGICAL HISTORY:  1. Abdominal hysterectomy in 1971.  2. Spurs.  3. Bunion surgery.  4. Appendectomy.   FAMILY HISTORY:  Her father died at age 64 of an MI.  Her mother died of  uremia, as well as coronary disease at age 46.   She has four brothers.  Two have died of heart disease and hypertension.  She has three sisters.  Colon cancer is present within the family.  One  sister died of pneumonia.   SOCIAL HISTORY:  She is retired.  She does not use tobacco or alcohol.   OTHER MEDICAL PROBLEMS:  1. Longstanding severe hypertension.  2. History of catheterization in 1992 which was normal.  3.  History of goiter for which she is on suppressive therapy.  4. Known peripheral neuropathy.  5. Left ventricular hypertrophy noted as far back as 1989.  6. A 2-D echocardiogram done in 2001 showed mild left ventricular     hypertrophy.  7. She has had chronic low back pain and generalized arthritic complaints.  8. Glucose intolerance leading to frank diabetes.   REVIEW OF SYSTEMS:  She has noted pallor for two weeks and dark stools.  She  has had sweats which are chronic.  She has chronic arthritis.   PHYSICAL EXAMINATION:  GENERAL APPEARANCE:  She is  a pale white female.  VITAL SIGNS:  Blood pressure 115/80, heart rate 59.  HEENT:  Negative.  LUNGS:  A few rales.  HEART:  Grade 2/6 systolic ejection murmur.  No S3.  Regular rate and  rhythm.  ABDOMEN:  Normal bowel sounds.  No masses.  No organomegaly.  Small and  rotund.  EXTREMITIES:  1-2+ edema.  Pulses are 2+.  RECTAL:  Exam was negative.  Stool was negative for blood.  No masses.  NEUROLOGIC:  She is intact.   LABORATORY DATA:  The EKG showed normal sinus rhythm with left ventricular  hypertrophy.   The chest x-ray is pending.   OVERALL IMPRESSION:  1. Congestive heart failure and chest pain, rule out myocardial infarction.  2. Adult onset diabetes mellitus.  3. Hypertension.   PLAN:  1. Will admit.  2.     Evaluate for congestive heart failure with echocardiogram.  3. She probably needs to have cardiac catheterization given her diabetes,     longstanding hypertension, positive family history of heart disease, and     her age of 37.                                                Colleen Can. Deborah Chalk, M.D.    SNT/MEDQ  D:  09/20/2002  T:  09/20/2002  Job:  [id number]   cc:   Miguel Aschoff, M.D.  [address]  [place]  [place]  [address]  Fax: [phone]   Al Decant. Janey Greaser, MD  [address]  [place]  [place] [address]  Fax: [phone]

## 2010-07-06 NOTE — Op Note (Signed)
   NAMECYLEIGH, Carroll                            ACCOUNT NO.:  [id number]   MEDICAL RECORD NO.:  [id number]                   PATIENT TYPE:  AMB   LOCATION:  ENDO                                 FACILITY:  [facility]   PHYSICIAN:  John C. Madilyn Fireman, M.D.                 DATE OF BIRTH:  November 03, 1920   DATE OF PROCEDURE:  01/11/2002  DATE OF DISCHARGE:                                 OPERATIVE REPORT   PROCEDURE PERFORMED:  Colonoscopy.   ENDOSCOPIST:  Barrie Folk, M.D.   INDICATIONS FOR PROCEDURE:  Family history of colon cancer in a first degree  relative.  Last colonoscopy six years ago.   DESCRIPTION OF PROCEDURE:  The patient was placed in the left lateral  decubitus position and placed on the pulse monitor with continuous low-flow  oxygen delivered by nasal cannula.  The patient was sedated with 75 mcg of  IV fentanyl and 6 mg of IV Versed.  The Olympus video colonoscope was  inserted into the rectum and advanced to the cecum, confirmed by  transillumination of McBurney's point and visualization of the ileocecal  valve and appendiceal orifice.  The prep was excellent.  The cecum,  ascending, transverse, descending and sigmoid colon all appeared normal with  no masses, polyps, diverticula or other mucosal abnormalities.  The rectum  likewise appeared normal and retroflex view of the anus revealed no obvious  internal hemorrhoids.  The colonoscope was then withdrawn and the patient  returned to the recovery room in stable condition.  She tolerated the  procedure well.  There were no immediate complications.   IMPRESSION:  Internal hemorrhoids.  Otherwise normal colonoscopy.   PLAN:  Repeat colonoscopy in five years.                                                   John C. Madilyn Fireman, M.D.    JCH/MEDQ  D:  01/11/2002  T:  01/11/2002  Job:  [id number]   cc:   C. Duane Lope, M.D.  [address]  [place]  [place] [address]  Fax: [phone]

## 2010-07-06 NOTE — Op Note (Signed)
Vanessa Carroll, Vanessa Carroll                            ACCOUNT NO.:  [id number]   MEDICAL RECORD NO.:  [id number]                   PATIENT TYPE:  INP   LOCATION:  1823                                 FACILITY:  [facility]   PHYSICIAN:  Sharlet Salina T. Hoxworth, M.D.          DATE OF BIRTH:  1920-08-29   DATE OF PROCEDURE:  DATE OF DISCHARGE:                                 OPERATIVE REPORT   DATE OF OPERATION:  August 16, 2003.   PREOPERATIVE DIAGNOSES:  Cholelithiasis, cholecystitis.   POSTOPERATIVE DIAGNOSES:  Cholelithiasis, cholecystitis.   SURGICAL PROCEDURES:  1. Laparoscopic cholecystectomy with intraoperative cholangiogram.  2. True-cut liver biopsy of liver   SURGEON:  Sharlet Salina T. Hoxworth, MD   ANESTHESIA:  General.   BRIEF HISTORY:  Ms. Manganelli is an 77 year old white female with multiple  medical problems including diabetes, who presents with the acute onset of  epigastric and right upper quadrant abdominal pain.  Gallbladder ultrasound  shows a thickened gallbladder wall with stones.  White count is elevated at  15,000.  She has moderately elevated LFTs with a bilirubin of 3.7 and mildly  elevated transaminases and alkaline phosphatase.  Laparoscopic  cholecystectomy with cholangiogram has been recommended and accepted.  The  needs for the procedure, indications, risks of bleeding, infection, bowel or  bladder injury were discussed and understood.  She is now brought to the  operating room for this procedure.   DESCRIPTION OF OPERATION:  The patient was brought to the operating room and  placed in the supine position on the operating table, and general  endotracheal anesthesia was induced.  She had received preoperative  antibiotics.  __________ were placed.  The abdomen was sterilely prepped and  draped.  Local anesthesia was used to infiltrate the trocar sites.  Through  a 1-cm incision, an Hassan trocar was placed through a mattress suture in  the umbilicus and a standard 4-port  technique used.  Of note was the liver  appeared mildly enlarged, firm, and somewhat nodular.  The gallbladder was  edematous, but without gangrene or significant, acute inflammation.  The  fundus was grasped and elevated.  The liver was not very mobile, making  exposure somewhat difficult, but with the 30-degree scope, retracting the  infundibulum inferolaterally, Calot's triangle in the porta hepatis could be  adequately visualized.  The fibrofatty tissue was carefully stripped off the  neck of the gallbladder toward the porta hepatis.  The distal gallbladder  was thoroughly dissected.  A branch of the cystic artery anterior to the  cystic duct was coursing upon the gallbladder wall and was divided between 2  proximal and 1 distal clip.  The cystic duct was identified just behind  this.  It was dissected out over about a centimeter.  The cystic duct-  gallbladder junction was dissected 360 degrees.  The cystic artery posterior  branch was identified directly behind this.  When the anatomy was  clear, the  cystic duct was clipped at its juncture with the gallbladder and operative  cholangiogram was obtained through the cystic duct.  This showed good  filling of a normal common bile duct and intrahepatic ducts, with free flow  into the duodenum and no filling defects.  Following this, the Cholangiocath  was removed and the cystic duct was doubly clipped proximally and divided.  The posterior branch of the cystic artery was divided between 2 proximal and  1 distal clip.  The gallbladder was then dissected free from its bed using  hook cautery and removed intact through the umbilicus.  Complete hemostasis  was obtained in the gallbladder bed with cautery and a Surgicel pack that  stayed dry.  I elected to do a true-cut needle biopsy due to the abnormal  appearance of the liver in association with abnormal LFTs and a normal  cholangiogram.  A true-cut needle was introduced through the  cholangiogram  puncture wound and a nice core of tissue obtained through the inferior edge  of the right lobe of the liver.  There was minimal bleeding and it was  controlled with cautery.  The abdomen was again inspected for hemostasis.  All CO2 was evacuated and trocars were removed.  The mattress suture was  secured to the umbilicus.  The skin incisions were closed with interrupted  subcuticular 4-0 Monocryl and Steri-Strips.  Sponge, needle, and instrument  counts were correct.  Dry, sterile dressings were applied, and the patient  taken to the recovery room in good condition.                                               Lorne Skeens. Hoxworth, M.D.    Tory Emerald  D:  08/16/2003  T:  08/16/2003  Job:  [id number]

## 2010-07-06 NOTE — Consult Note (Signed)
Vanessa Carroll, Vanessa Carroll                            ACCOUNT NO.:  [id number]   MEDICAL RECORD NO.:  [id number]                   PATIENT TYPE:  INP   LOCATION:  2011                                 FACILITY:  [facility]   PHYSICIAN:  James L. Malon Kindle., M.D.          DATE OF BIRTH:  March 29, 1920   DATE OF CONSULTATION:  09/23/2002  DATE OF DISCHARGE:                                   CONSULTATION   REASON FOR CONSULTATION:  Anemia.   HISTORY OF PRESENT ILLNESS:  An 77 year old white female who was admitted to  the hospital over the past 6-8 days with increasing abdominal distention,  edema, shortness of breath, and increasing weight.  She is quite dyspneic,  and cardiac injury was ruled out with negative enzymes.  She is thought to  be in congestive heart failure, and did receive diuresis.  She has done well  with that.  Her stools were negative on admission.  Hemoglobin was 8.6 on  admission, and she has been transfused.  Her work-up has included normal  iron studies with a low B-12 of 118.  We were asked to see the patient to  evaluate for GI bleeding.  She had heme negative stools on admission.  A  colonoscopy done in November of 2003 by Dr. Dorena Cookey was negative, other  than hemorrhoids.  The patient does note intermittent bouts of dark stool.  She has never had ulcer disease.  She has had vague epigastric fullness.  Also has had some bright blood with wiping.  She states her urine was dark  recently, but now it apparently is lightening up.  Of note is that she had  no urinalysis.  Since her admission here, she feels better.  She has had no  further chest pain.  Her hemoglobin has come up with a transfusion, and she  underwent a cardiac catheterization showing normal coronaries and normal  left ventricular function.   MEDICATIONS ON ADMISSION:  1. Klotrix 10 mEq daily.  2. Diovan 160 mg daily.  3. Synthroid 0.1 mg daily.  4. Clonidine 0.1 mg b.i.d.  5. Gemfibrozil 600 mg b.i.d.  6. Glucosamine 200 mg daily.  7. Tenormin 50 mg daily.  8. Demadex 10 mg daily.  9. Avandia 2 mg daily.  10.      Neurontin 300 mg b.i.d.  11.      Bextra 10 mg daily.   ALLERGIES:  1. SULFA.  2. CODEINE.   MEDICAL HISTORY:  She has had an abdominal hysterectomy in 1971.  Has marked  degenerative back disease with bone disease with bone spurs.  Has had bunion  surgery and an appendectomy.   FAMILY HISTORY:  Positive for heart disease.  Her parents died of heart  attacks.  Positive for kidney failure.  Several siblings have had heart  disease.  There is some colon cancer within the family.   SOCIAL HISTORY:  She is retired.  Does not smoke or drink.  Lives at a  friend's home.   REVIEW OF SYSTEMS:  Remarkable for some bright blood with wiping.  Fairly  normal bowel movements, although she has not had a bowel movement now in the  hospital here in five days.  She never had any liver disease.  Other review  of systems all negative, other than the cardiac symptoms, which were  previously noted.   PHYSICAL EXAMINATION:  VITAL SIGNS:  Blood pressure 120/65, temperature  98.1, pulse 60.  GENERAL:  A pleasant white female in no acute distress.  Eyes clear and  nonicteric.  NECK:  Supple.  No lymphadenopathy.  LUNGS:  Clear.  HEART:  Irregularly irregular without murmurs or gallops.  ABDOMEN:  Soft, nondistended, nontender, with normal bowel sounds.  EXTREMITIES:  Without edema.   PERTINENT DATABASE:  Hemoglobin 8.6 on admission with an MCV of 101.  Reticulocyte count was elevated at 6.1.  Pro time elevated at 15.  Liver  tests were normal with normal transaminases.  Total bilirubin slightly up at  1.3.  B-12 level low at 118.  RBC, folate, and ferratin normal.   ASSESSMENT:  1. Anemia.  It may well be this is all due to B-12 deficiency.  She has had     a history of dark stools.  Has had some vague epigastric discomfort, so     for that reason, I think it would certainly be  reasonable to go ahead     with an upper endoscopy during this admission; also upper     gastrointestinal pathology can be associated with B-12 deficiency.  2. History of recent normal colonoscopy.   PLAN:  1. Will arrange an upper endoscopy in the morning.  I have discussed this     with her, and she is agreeable.  2. Will go ahead and give an IM B-12 injection today.                                               James L. Malon Kindle., M.D.    Waldron Session  D:  09/23/2002  T:  09/24/2002  Job:  [id number]   cc:   Colleen Can. Deborah Chalk, M.D.  [address]  [place]  [place] [address]  Fax: [phone]   C. Duane Lope, M.D.  [address]  [place]  [place] [address]  Fax: [phone]

## 2010-12-03 LAB — BASIC METABOLIC PANEL
CO2: 26
Calcium: 9.2
Creatinine, Ser: 0.8
GFR calc non Af Amer: >60
Glucose, Bld: 297 — ABNORMAL HIGH

## 2010-12-03 LAB — URINALYSIS, ROUTINE W REFLEX MICROSCOPIC
Nitrite: NEGATIVE
Specific Gravity, Urine: 1.01
Urobilinogen, UA: 0.2

## 2010-12-03 LAB — URINE MICROSCOPIC-ADD ON

## 2010-12-03 LAB — CBC
MCHC: 34.4
Platelets: 191
RDW: 12.8

## 2010-12-03 LAB — DIFFERENTIAL
Basophils Absolute: 0
Basophils Relative: 0
Neutro Abs: 6.9
Neutrophils Relative %: 84 — ABNORMAL HIGH

## 2010-12-21 ENCOUNTER — Emergency Department (HOSPITAL_COMMUNITY): Payer: Medicare Other

## 2010-12-21 ENCOUNTER — Inpatient Hospital Stay (HOSPITAL_COMMUNITY)
Admission: EM | Admit: 2010-12-21 | Discharge: 2010-12-25 | DRG: 191 | Disposition: A | Discharge status: Home Health | Payer: Medicare Other | Attending: Family Medicine | Admitting: Family Medicine

## 2010-12-21 DIAGNOSIS — E1169 Type 2 diabetes mellitus with other specified complication: Secondary | ICD-10-CM | POA: Diagnosis present

## 2010-12-21 DIAGNOSIS — G20A1 Parkinson's disease without dyskinesia, without mention of fluctuations: Secondary | ICD-10-CM | POA: Diagnosis present

## 2010-12-21 DIAGNOSIS — C9110 Chronic lymphocytic leukemia of B-cell type not having achieved remission: Secondary | ICD-10-CM | POA: Diagnosis present

## 2010-12-21 DIAGNOSIS — J45901 Unspecified asthma with (acute) exacerbation: Secondary | ICD-10-CM | POA: Diagnosis present

## 2010-12-21 DIAGNOSIS — E876 Hypokalemia: Secondary | ICD-10-CM

## 2010-12-21 DIAGNOSIS — N390 Urinary tract infection, site not specified: Secondary | ICD-10-CM

## 2010-12-21 DIAGNOSIS — R5381 Other malaise: Secondary | ICD-10-CM | POA: Diagnosis present

## 2010-12-21 DIAGNOSIS — E1140 Type 2 diabetes mellitus with diabetic neuropathy, unspecified: Secondary | ICD-10-CM | POA: Diagnosis present

## 2010-12-21 DIAGNOSIS — J441 Chronic obstructive pulmonary disease with (acute) exacerbation: Principal | ICD-10-CM | POA: Diagnosis present

## 2010-12-21 DIAGNOSIS — E162 Hypoglycemia, unspecified: Secondary | ICD-10-CM | POA: Diagnosis present

## 2010-12-21 DIAGNOSIS — G20 Parkinson's disease: Secondary | ICD-10-CM | POA: Diagnosis present

## 2010-12-21 DIAGNOSIS — E119 Type 2 diabetes mellitus without complications: Secondary | ICD-10-CM

## 2010-12-21 LAB — CBC
HCT: 37.4 % (ref 36.0–46.0)
Hemoglobin: 12.4 g/dL (ref 12.0–15.0)
MCHC: 33.2 g/dL (ref 30.0–36.0)
RBC: 4.16 MIL/uL (ref 3.87–5.11)

## 2010-12-21 LAB — LACTIC ACID, PLASMA: Lactic Acid, Venous: 1 mmol/L (ref 0.5–2.2)

## 2010-12-21 LAB — URINALYSIS, ROUTINE W REFLEX MICROSCOPIC
Bilirubin Urine: NEGATIVE
Hgb urine dipstick: NEGATIVE
Nitrite: NEGATIVE
Specific Gravity, Urine: 1.015 (ref 1.005–1.030)
pH: 6.5 (ref 5.0–8.0)

## 2010-12-21 LAB — DIFFERENTIAL
Basophils Absolute: 0 10*3/uL (ref 0.0–0.1)
Lymphocytes Relative: 16 % (ref 12–46)
Monocytes Absolute: 0.6 10*3/uL (ref 0.1–1.0)
Neutro Abs: 4 10*3/uL (ref 1.7–7.7)

## 2010-12-21 LAB — BASIC METABOLIC PANEL
BUN: 13 mg/dL (ref 6–23)
CO2: 29 mEq/L (ref 19–32)
Chloride: 105 mEq/L (ref 96–112)
GFR calc non Af Amer: 75 mL/min — ABNORMAL LOW (ref >90)
Glucose, Bld: 114 mg/dL — ABNORMAL HIGH (ref 70–99)
Potassium: 4.2 mEq/L (ref 3.5–5.1)

## 2010-12-21 LAB — POCT I-STAT TROPONIN I: Troponin i, poc: 0.01 ng/mL (ref 0.00–0.08)

## 2010-12-21 LAB — GLUCOSE, CAPILLARY: Glucose-Capillary: 114 mg/dL — ABNORMAL HIGH (ref 70–99)

## 2010-12-21 LAB — URINE MICROSCOPIC-ADD ON

## 2010-12-21 MED ORDER — BENZTROPINE MESYLATE 0.5 MG PO TABS
0.5000 mg | ORAL_TABLET | Freq: Every day | ORAL | Status: DC
  Administered 2010-12-23 – 2010-12-25 (×3): 0.5 mg via ORAL
  Filled 2010-12-21 (×4): qty 1

## 2010-12-21 MED ORDER — DOCUSATE SODIUM 100 MG PO CAPS
100.0000 mg | ORAL_CAPSULE | Freq: Two times a day (BID) | ORAL | Status: DC
  Administered 2010-12-23 – 2010-12-25 (×5): 100 mg via ORAL
  Filled 2010-12-21 (×9): qty 1

## 2010-12-21 MED ORDER — ALUM & MAG HYDROXIDE-SIMETH 200-200-20 MG/5ML PO SUSP: 30.0000 mL | Freq: Four times a day (QID) | ORAL | Status: DC | PRN

## 2010-12-21 MED ORDER — ONDANSETRON HCL 4 MG PO TABS: 4.0000 mg | ORAL_TABLET | Freq: Four times a day (QID) | ORAL | Status: DC | PRN

## 2010-12-21 MED ORDER — LOSARTAN POTASSIUM 50 MG PO TABS
50.0000 mg | ORAL_TABLET | Freq: Every day | ORAL | Status: DC
  Filled 2010-12-21 (×2): qty 1

## 2010-12-21 MED ORDER — SODIUM CHLORIDE 0.9 % IV SOLN
INTRAVENOUS | Status: DC
  Administered 2010-12-23 – 2010-12-24 (×3): via INTRAVENOUS

## 2010-12-21 MED ORDER — ATENOLOL 50 MG PO TABS
50.0000 mg | ORAL_TABLET | Freq: Two times a day (BID) | ORAL | Status: DC
  Administered 2010-12-23 – 2010-12-25 (×5): 50 mg via ORAL
  Filled 2010-12-21 (×8): qty 1

## 2010-12-21 MED ORDER — TORSEMIDE 10 MG PO TABS
10.0000 mg | ORAL_TABLET | Freq: Every day | ORAL | Status: DC
  Administered 2010-12-23 – 2010-12-24 (×2): 10 mg via ORAL
  Filled 2010-12-21 (×4): qty 1

## 2010-12-21 MED ORDER — INSULIN ASPART 100 UNIT/ML ~~LOC~~ SOLN
0.0000 [IU] | Freq: Every day | SUBCUTANEOUS | Status: DC
  Filled 2010-12-21: qty 3

## 2010-12-21 MED ORDER — ALBUTEROL SULFATE (5 MG/ML) 0.5% IN NEBU
2.5000 mg | INHALATION_SOLUTION | Freq: Four times a day (QID) | RESPIRATORY_TRACT | Status: DC
  Administered 2010-12-23 (×2): 2.5 mg via RESPIRATORY_TRACT
  Administered 2010-12-24: 15:00:00 via RESPIRATORY_TRACT
  Administered 2010-12-24: 2.5 mg via RESPIRATORY_TRACT
  Filled 2010-12-21 (×18): qty 20

## 2010-12-21 MED ORDER — ENOXAPARIN SODIUM 40 MG/0.4ML ~~LOC~~ SOLN
40.0000 mg | SUBCUTANEOUS | Status: DC
  Administered 2010-12-23 – 2010-12-24 (×2): 40 mg via SUBCUTANEOUS
  Filled 2010-12-21 (×5): qty 0.4

## 2010-12-21 MED ORDER — POTASSIUM CHLORIDE CRYS ER 10 MEQ PO TBCR
10.0000 meq | EXTENDED_RELEASE_TABLET | Freq: Every day | ORAL | Status: DC
  Administered 2010-12-23: 10 meq via ORAL
  Filled 2010-12-21 (×2): qty 1

## 2010-12-21 MED ORDER — ACETAMINOPHEN 325 MG PO TABS: 650.0000 mg | ORAL_TABLET | ORAL | Status: DC | PRN

## 2010-12-21 MED ORDER — GLIMEPIRIDE 4 MG PO TABS
4.0000 mg | ORAL_TABLET | Freq: Every day | ORAL | Status: DC
  Administered 2010-12-23: 4 mg via ORAL
  Filled 2010-12-21 (×2): qty 1

## 2010-12-21 MED ORDER — CYANOCOBALAMIN 1000 MCG/ML IJ SOLN
1000.0000 ug | Freq: Once | INTRAMUSCULAR | Status: DC
  Filled 2010-12-21: qty 1

## 2010-12-21 MED ORDER — ONDANSETRON HCL 4 MG/2ML IJ SOLN
4.0000 mg | Freq: Four times a day (QID) | INTRAMUSCULAR | Status: DC | PRN
  Administered 2010-12-23: 4 mg via INTRAVENOUS

## 2010-12-21 MED ORDER — LEVOFLOXACIN IN D5W 500 MG/100ML IV SOLN
500.0000 mg | INTRAVENOUS | Status: DC
  Filled 2010-12-21 (×2): qty 100

## 2010-12-21 MED ORDER — SERTRALINE HCL 50 MG PO TABS
50.0000 mg | ORAL_TABLET | Freq: Every day | ORAL | Status: DC
  Administered 2010-12-23 – 2010-12-25 (×3): 50 mg via ORAL
  Filled 2010-12-21 (×4): qty 1

## 2010-12-21 MED ORDER — INSULIN ASPART 100 UNIT/ML ~~LOC~~ SOLN
0.0000 [IU] | Freq: Three times a day (TID) | SUBCUTANEOUS | Status: DC
  Administered 2010-12-23: 3 [IU] via SUBCUTANEOUS
  Administered 2010-12-24 – 2010-12-25 (×2): 1 [IU] via SUBCUTANEOUS
  Filled 2010-12-21: qty 3

## 2010-12-21 MED ORDER — ALBUTEROL SULFATE (5 MG/ML) 0.5% IN NEBU
2.5000 mg | INHALATION_SOLUTION | RESPIRATORY_TRACT | Status: DC | PRN
  Filled 2010-12-21: qty 20

## 2010-12-21 MED ORDER — GABAPENTIN 300 MG PO CAPS
300.0000 mg | ORAL_CAPSULE | Freq: Every day | ORAL | Status: DC
  Administered 2010-12-23 – 2010-12-25 (×4): 300 mg via ORAL
  Filled 2010-12-21 (×4): qty 1

## 2010-12-21 MED ORDER — SENNA 8.6 MG PO TABS: 2.0000 | ORAL_TABLET | Freq: Every day | ORAL | Status: DC | PRN

## 2010-12-21 MED ORDER — GUAIFENESIN ER 600 MG PO TB12
600.0000 mg | ORAL_TABLET | Freq: Two times a day (BID) | ORAL | Status: DC
  Administered 2010-12-23 – 2010-12-25 (×5): 600 mg via ORAL
  Filled 2010-12-21 (×9): qty 1

## 2010-12-21 MED ORDER — GABAPENTIN 600 MG PO TABS
600.0000 mg | ORAL_TABLET | Freq: Every day | ORAL | Status: DC
  Administered 2010-12-23 – 2010-12-24 (×2): 600 mg via ORAL
  Filled 2010-12-21 (×4): qty 1

## 2010-12-21 MED ORDER — SERTRALINE HCL 25 MG PO TABS
25.0000 mg | ORAL_TABLET | Freq: Every day | ORAL | Status: DC
  Administered 2010-12-23 – 2010-12-24 (×2): 25 mg via ORAL
  Filled 2010-12-21 (×4): qty 1

## 2010-12-21 MED ORDER — CARBIDOPA-LEVODOPA 25-250 MG PO TABS
1.0000 | ORAL_TABLET | Freq: Three times a day (TID) | ORAL | Status: DC
  Filled 2010-12-21 (×6): qty 1

## 2010-12-21 MED ORDER — LEVOTHYROXINE SODIUM 100 MCG PO TABS
100.0000 ug | ORAL_TABLET | ORAL | Status: DC
  Administered 2010-12-23 – 2010-12-25 (×3): 100 ug via ORAL
  Filled 2010-12-21 (×5): qty 1

## 2010-12-22 LAB — CBC
HCT: 35.5 % — ABNORMAL LOW (ref 36.0–46.0)
MCHC: 32.1 g/dL (ref 30.0–36.0)
MCV: 92.2 fL (ref 78.0–100.0)
Platelets: 119 10*3/uL — ABNORMAL LOW (ref 150–400)
RDW: 13.2 % (ref 11.5–15.5)
WBC: 5.5 10*3/uL (ref 4.0–10.5)

## 2010-12-22 LAB — URINE CULTURE
Colony Count: NO GROWTH
Culture  Setup Time: 201211021701
Culture: NO GROWTH

## 2010-12-22 LAB — COMPREHENSIVE METABOLIC PANEL
AST: 19 U/L (ref 0–37)
Albumin: 3 g/dL — ABNORMAL LOW (ref 3.5–5.2)
BUN: 14 mg/dL (ref 6–23)
Chloride: 106 mEq/L (ref 96–112)
Creatinine, Ser: 0.76 mg/dL (ref 0.50–1.10)
Potassium: 4 mEq/L (ref 3.5–5.1)
Total Bilirubin: 0.6 mg/dL (ref 0.3–1.2)
Total Protein: 5.4 g/dL — ABNORMAL LOW (ref 6.0–8.3)

## 2010-12-22 LAB — LIPID PANEL
HDL: 24 mg/dL — ABNORMAL LOW (ref >39)
Triglycerides: 155 mg/dL — ABNORMAL HIGH (ref <150)

## 2010-12-22 LAB — DIFFERENTIAL
Basophils Absolute: 0 10*3/uL (ref 0.0–0.1)
Eosinophils Absolute: 0.2 10*3/uL (ref 0.0–0.7)
Eosinophils Relative: 4 % (ref 0–5)
Monocytes Absolute: 0.5 10*3/uL (ref 0.1–1.0)

## 2010-12-22 MED ORDER — LOSARTAN POTASSIUM 50 MG PO TABS
100.0000 mg | ORAL_TABLET | Freq: Every day | ORAL | Status: DC
  Administered 2010-12-23 – 2010-12-24 (×2): 100 mg via ORAL
  Filled 2010-12-22 (×4): qty 2

## 2010-12-22 MED ORDER — CARBIDOPA-LEVODOPA 25-250 MG PO TABS
2.0000 | ORAL_TABLET | Freq: Three times a day (TID) | ORAL | Status: DC
  Administered 2010-12-23 – 2010-12-25 (×8): 2 via ORAL
  Filled 2010-12-22 (×10): qty 2

## 2010-12-22 NOTE — H&P (Signed)
NAMEGATHA, Vanessa Carroll NO.:  [id number]  MEDICAL RECORD NO.:  [id number]  LOCATION:  1336                         FACILITY:  [facility]  PHYSICIAN:  Arne Cleveland, MD       DATE OF BIRTH:  12-08-1920  DATE OF ADMISSION:  12/21/2010 DATE OF DISCHARGE:                             HISTORY & PHYSICAL   PRIMARY CARE PHYSICIAN:  Unassigned.  CHIEF COMPLAINT:  Weakness.  HISTORY OF PRESENT ILLNESS:  Ms. Blecher is an 77 year old Caucasian female who had been feeling poorly for the past 2 weeks.  She had been feeling poorly ever since getting her flu vaccine.  She has had productive cough and coughing up some green gunky stuff that she has been doing for the past 2 weeks.  In addition to the cough, she has been getting progressively weaker.  Yesterday all she could do to get to the dining room from her assisted living facility and today she was not able to get out of bed.  Her neighbor noticed that she had not gotten her paper and called her and then came over and paramedics were called.  She was brought to [facility].  PAST MEDICAL HISTORY:  Significant for: 1. Parkinson disease. 2. Chronic lymphocytic leukemia. 3. Diabetes type 2. 4. Asthma. 5. Peripheral neuropathy.  PAST SURGICAL HISTORY:  She has had hysterectomy, cholecystectomy, appendectomy, and few years ago, she had a benign tumor removed from her bladder.  SOCIAL HISTORY:  She lives in assisted living facility.  Does not smoke, drink, or use illicit drugs.  DRUG ALLERGIES:  CODEINE and SULFA.  MEDICATIONS:  She takes: 1. Atenolol 50 mg 1 tab twice a day. 2. Carbidopa and levodopa 25/250 dose 2 tabs 3 times a day. 3. Gabapentin 300 mg one to two capsules twice a day. 4. Glimepiride 4 mg 1 tab every morning. 5. Sertraline 25 mg 1 to 2 tabs twice a day. 6. Benztropine 1 tab daily. 7. Losartan 100 mg daily. 8. Synthroid 100 mcg dose 1 tab 3 times a day. 9. Demadex 10 mg daily. 10.Klor-Con 10  mEq daily.  REVIEW OF SYSTEMS:  As per history of present illness.  She denied any chest pain, any focal weakness, or neurological problems other than her chronic Parkinson's disease.  She denied any nausea, vomiting, or diarrhea, fever, chills.  Denied rash.  All systems were reviewed and those of history of present illness and chronic medical problems were the only findings.  PHYSICAL EXAM:  GENERAL:  She is well developed, well nourished, generalized weakness, in no acute distress. VITAL SIGNS:  Temperature is 98.9, pulse 68, respirations 22, blood pressure 193/58. HEENT:  Examination of her head is atraumatic, normocephalic.  Eyes: Pupils equal, round, reactive to light.  Discs sharp.  Extraocular muscle range of motion is full. NECK:  Supple without jugular venous distention, thyromegaly, or thyroid mass. CHEST:  Nontender. LUNGS:  Scattered wheezing, otherwise, clear. BREASTS:  Deferred at patient's request. PELVIC:  Deferred at patient's request. RECTAL:  Deferred at patient's request. HEART:  Regular rhythm and rate.  Normal S1, S2 without murmur, gallop, or rub. ABDOMEN:  Protuberant, soft, nontender with normoactive  bowel sounds. EXTREMITIES:  There is no clubbing, cyanosis, or edema. SKIN:  There is no unusual rash or lesions noted. NEURO:  Cranial nerves 2 through 12 intact.  Motor:  She has generalized weakness, but is able to move her extremities and has no focal deficits. She has decreased sensory to the both lower extremities, but withdraws to painful stimuli.  LABORATORY:  She had a CT of her head which showed moderate small vessel disease type changes.  No intracranial hemorrhage or evidence of large acute infarct.  Chest x-ray showed stable cardiomegaly without pulmonary edema.  Moderate changes of acute bronchitis or asthma.  No acute cardiopulmonary disease.  Otherwise, her white count is 5600, hemoglobin 12.4, hematocrit 37.4 with a normal differential.   Urine shows small leukocytes on dipstick.  Basic metabolic panel is normal except for glucose of 114.  IMPRESSION:  Bronchitis, asthma exacerbation, generalized weakness, urinary tract infection.  Multiple medical problems including diabetes, hypertension, Parkinson's, neuropathy, hypothyroidism.  PLAN:  My plan is to admit the patient to [facility].  Patient will be started on IV antibiotics, will receive nebulized treatments, mucolytics, fluids.  She will be put on diabetic diet.  We will continue on her routine medications and monitor her course. Hopefully, with fluids and IV antibiotics, her weakness will be improved.  She will be referred to Physical Therapy.  Further evaluation and treatment as indicated by hospital course.     Arne Cleveland, MD     ML/MEDQ  D:  12/21/2010  T:  12/22/2010  Job:  [id number]  Electronically Signed by Arne Cleveland  on 12/22/2010 11:56:21 AM

## 2010-12-23 DIAGNOSIS — R5381 Other malaise: Secondary | ICD-10-CM | POA: Diagnosis present

## 2010-12-23 DIAGNOSIS — E1140 Type 2 diabetes mellitus with diabetic neuropathy, unspecified: Secondary | ICD-10-CM | POA: Diagnosis present

## 2010-12-23 DIAGNOSIS — C9110 Chronic lymphocytic leukemia of B-cell type not having achieved remission: Secondary | ICD-10-CM | POA: Diagnosis present

## 2010-12-23 DIAGNOSIS — G20 Parkinson's disease: Secondary | ICD-10-CM | POA: Diagnosis present

## 2010-12-23 DIAGNOSIS — J45901 Unspecified asthma with (acute) exacerbation: Secondary | ICD-10-CM | POA: Diagnosis present

## 2010-12-23 LAB — GLUCOSE, CAPILLARY
Glucose-Capillary: 121 mg/dL — ABNORMAL HIGH (ref 70–99)
Glucose-Capillary: 121 mg/dL — ABNORMAL HIGH (ref 70–99)
Glucose-Capillary: 68 mg/dL — ABNORMAL LOW (ref 70–99)
Glucose-Capillary: 84 mg/dL (ref 70–99)

## 2010-12-23 LAB — BASIC METABOLIC PANEL
BUN: 11 mg/dL (ref 6–23)
CO2: 28 mEq/L (ref 19–32)
Calcium: 8.7 mg/dL (ref 8.4–10.5)
Creatinine, Ser: 0.71 mg/dL (ref 0.50–1.10)
GFR calc non Af Amer: 75 mL/min — ABNORMAL LOW (ref >90)
Glucose, Bld: 68 mg/dL — ABNORMAL LOW (ref 70–99)

## 2010-12-23 MED ORDER — ALBUTEROL SULFATE (5 MG/ML) 0.5% IN NEBU
INHALATION_SOLUTION | RESPIRATORY_TRACT | Status: AC
  Administered 2010-12-23: 2.5 mg via RESPIRATORY_TRACT
  Filled 2010-12-23: qty 0.5

## 2010-12-23 MED ORDER — ONDANSETRON HCL 4 MG/2ML IJ SOLN
INTRAMUSCULAR | Status: AC
  Administered 2010-12-23: 4 mg via INTRAVENOUS
  Filled 2010-12-23: qty 2

## 2010-12-23 MED ORDER — POTASSIUM CHLORIDE CRYS ER 20 MEQ PO TBCR
20.0000 meq | EXTENDED_RELEASE_TABLET | Freq: Two times a day (BID) | ORAL | Status: DC
  Administered 2010-12-23 – 2010-12-25 (×4): 20 meq via ORAL
  Filled 2010-12-23 (×6): qty 1

## 2010-12-23 MED ORDER — GLIMEPIRIDE 2 MG PO TABS: 2.0000 mg | ORAL_TABLET | Freq: Every day | ORAL | Status: DC

## 2010-12-23 MED ORDER — POTASSIUM CHLORIDE CRYS ER 10 MEQ PO TBCR
10.0000 meq | EXTENDED_RELEASE_TABLET | ORAL | Status: AC
  Administered 2010-12-23: 10 meq via ORAL
  Filled 2010-12-23: qty 1

## 2010-12-23 MED ORDER — LEVOFLOXACIN 500 MG PO TABS
500.0000 mg | ORAL_TABLET | Freq: Every day | ORAL | Status: DC
  Administered 2010-12-23 – 2010-12-25 (×3): 500 mg via ORAL
  Filled 2010-12-23 (×3): qty 1

## 2010-12-23 NOTE — Progress Notes (Signed)
Subjective: The patient is reporting that she is feeling a little better.  She is still not ambulating well without significant weakness.  She is still weak and wheezing but a little improvement is noted.  She is concerned about elevated blood sugars but says she has been eating very sweet high carb foods.  She denies SOB, CP and rash.    Objective: Filed Vitals:   12/23/10 0802 12/23/10 0949 12/23/10 0959 12/23/10 1351  BP:  177/80  178/78  Pulse:  71  75  Temp:  98.1 F (36.7 C)  98.4 F (36.9 C)  TempSrc:      Resp:  16  16  Height:      Weight: 85.8 kg (189 lb 2.5 oz)     SpO2:  94% 96% 97%   Weight change:   Intake/Output Summary (Last 24 hours) at 12/23/10 1407 Last data filed at 12/23/10 1352  Gross per 24 hour  Intake   1561 ml  Output   1901 ml  Net   -340 ml   ROS See HPI otherwise all reviewed and reported negative  General: Alert, awake, oriented x3, in no acute distress.  HEENT: No bruits, no goiter.  Heart: Regular rate and rhythm, without murmurs, rubs, gallops.  Lungs: bilateral exp wheezes heard Abdomen: Soft, nontender, nondistended, positive bowel sounds.  Neuro: Grossly intact, nonfocal.   BG 68-202   Lab Results:  [facility] 12/23/10 0405 12/22/10 0345  NA 142 140  K 3.3* 4.0  CL 106 106  CO2 28 27  GLUCOSE 68* 113*  BUN 11 14  CREATININE 0.71 0.76  CALCIUM 8.7 8.9  MG -- --  PHOS -- --    Basename 12/22/10 0345  AST 19  ALT <5  ALKPHOS 98  BILITOT 0.6  PROT 5.4*  ALBUMIN 3.0*   No results found for this basename: LIPASE:2,AMYLASE:2 in the last 72 hours  Basename 12/22/10 0345 12/21/10 1205  WBC 5.5 5.6  NEUTROABS 3.5 4.0  HGB 11.4* 12.4  HCT 35.5* 37.4  MCV 92.2 89.9  PLT 119* 125*   No results found for this basename: CKTOTAL:3,CKMB:3,CKMBINDEX:3,TROPONINI:3 in the last 72 hours No results found for this basename: POCBNP:3 in the last 72 hours No results found for this basename: DDIMER:2 in the last 72 hours  Basename  12/23/10 0405  HGBA1C 5.4    Basename 12/22/10 0345  CHOL 157  HDL 24*  LDLCALC 102*  TRIG 155*  CHOLHDL 6.5  LDLDIRECT --    Basename 12/22/10 0345  TSH 1.284  T4TOTAL --  T3FREE --  THYROIDAB --   No results found for this basename: VITAMINB12:2,FOLATE:2,FERRITIN:2,TIBC:2,IRON:2,RETICCTPCT:2 in the last 72 hours  Micro Results: Recent Results (from the past 240 hour(s))  URINE CULTURE     Status: Normal   Collection Time   12/21/10 11:43 AM      Component Value Range Status Comment   Specimen Description URINE, CATHETERIZED   Final    Special Requests NONE   Final    Setup Time [id number]   Final    Colony Count NO GROWTH   Final    Culture NO GROWTH   Final    Report Status 12/22/2010 FINAL   Final   CULTURE, BLOOD (ROUTINE X 2)     Status: Normal (Preliminary result)   Collection Time   12/21/10 12:07 PM      Component Value Range Status Comment   Specimen Description BLOOD RIGHT ANTECUBITAL   Final    Special  Requests BOTTLES DRAWN AEROBIC AND ANAEROBIC 5CC EACH   Final    Setup Time [phone]   Final    Culture     Final    Value:        BLOOD CULTURE RECEIVED NO GROWTH TO DATE CULTURE WILL BE HELD FOR 5 DAYS BEFORE ISSUING A FINAL NEGATIVE REPORT   Report Status PENDING   Incomplete   CULTURE, BLOOD (ROUTINE X 2)     Status: Normal (Preliminary result)   Collection Time   12/21/10 12:54 PM      Component Value Range Status Comment   Specimen Description BLOOD LEFT HAND   Final    Special Requests BOTTLES DRAWN AEROBIC ONLY   Final    Setup Time [id number]   Final    Culture     Final    Value:        BLOOD CULTURE RECEIVED NO GROWTH TO DATE CULTURE WILL BE HELD FOR 5 DAYS BEFORE ISSUING A FINAL NEGATIVE REPORT   Report Status PENDING   Incomplete     Studies/Results: No results found.  Medications: I have reviewed the patient's current medications.   Assessment and Plan   Asthma exacerbation (12/23/2010)   Assessment: improving   Plan:  Continue current mgmt Type II or unspecified type diabetes mellitus without mention of complication, not stated as uncontrolled (12/23/2010)   Assessment: poorly controlled   Plan: see orders for adjustments to treatment CLL (chronic lymphocytic leukemia) (12/23/2010)   Assessment: stable   Plan: stable, monitoring Parkinson disease (12/23/2010)   Assessment: stable   Plan: continue PT treatment Physical deconditioning (12/23/2010)   Assessment: persistent   Plan: Continue PT treatment Hypokalemia - Replace p.o. today   LOS: 2 days   Tiyana Galla 12/23/2010, 2:07 PM

## 2010-12-23 NOTE — Progress Notes (Signed)
Physical Therapy Evaluation Patient Details Name: Vanessa Carroll MRN: [id number] DOB: [birth year] Today's Date: 12/23/2010  Problem List:  Patient Active Problem List  Diagnoses  . Asthma exacerbation  . Type II or unspecified type diabetes mellitus without mention of complication, not stated as uncontrolled  . CLL (chronic lymphocytic leukemia)  . Parkinson disease  . Physical deconditioning    Past Medical History: No past medical history on file. Past Surgical History: No past surgical history on file.  PT Assessment/Plan/Recommendation PT Assessment Clinical Impression Statement: pt with deconditioning, needs assist with supine to sit  PT Recommendation/Assessment: Patient will need skilled PT in the acute care venue PT Problem List: Decreased strength;Decreased activity tolerance;Decreased mobility Barriers to Discharge: None Barriers to Discharge Comments: son reports patient will be discharged to [facility] PT Therapy Diagnosis : Difficulty walking;Generalized weakness (dependence in bed mobility) PT Plan PT Frequency: Min 3X/week PT Treatment/Interventions: Gait training;Functional mobility training;Therapeutic exercise;Patient/family education PT Recommendation Recommendations for Other Services: OT consult Follow Up Recommendations: Skilled nursing facility Equipment Recommended: Defer to next venue PT Goals  Acute Rehab PT Goals PT Goal Formulation: With patient/family Time For Goal Achievement: 2 weeks Pt will Roll Supine to Right Side: with supervision;with rail PT Goal: Rolling Supine to Right Side - Progress: Progressing toward goal Pt will Roll Supine to Left Side: with supervision;with rail PT Goal: Rolling Supine to Left Side - Progress: Progressing toward goal Pt will go Supine/Side to Sit: with min assist;with rail;with HOB 0 degrees PT Goal: Supine/Side to Sit - Progress: Progressing toward goal Pt will Transfer Sit to Stand/Stand to Sit: with  modified independence;with upper extremity assist PT Transfer Goal: Sit to Stand/Stand to Sit - Progress: Progressing toward goal Pt will Ambulate: >150 feet;with modified independence;with rolling walker PT Goal: Ambulate - Progress: Progressing toward goal Pt will Perform Home Exercise Program: with supervision, verbal cues required/provided PT Goal: Perform Home Exercise Program - Progress: Progressing toward goal  PT Evaluation Precautions/Restrictions    Prior Functioning  Home Living Lives With: Other (Comment);Alone (Independent living at [facility]) Type of Home: Independent living facility Home Layout: One level Home Access: Level entry Prior Function Level of Independence: Requires assistive device for independence Cognition Cognition Arousal/Alertness: Awake/alert Overall Cognitive Status: Appears within functional limits for tasks assessed Orientation Level: Oriented X4 Sensation/Coordination Sensation Light Touch: Impaired by gross assessment Stereognosis: Impaired by gross assessment Hot/Cold: Impaired by gross assessment Proprioception: Impaired by gross assessment Additional Comments: history of neuropathy Coordination Gross Motor Movements are Fluid and Coordinated: Yes Extremity Assessment RLE Assessment RLE Assessment:  (pt with skin lesion on ant leg) Mobility (including Balance) Bed Mobility Rolling Right: 4: Min assist Rolling Right Details (indicate cue type and reason): manual cues to roll lower body Rolling Left: 4: Min assist Rolling Left Details (indicate cue type and reason): manual cues to roll lower body Right Sidelying to Sit: 3: Mod assist;With rails Right Sidelying to Sit Details (indicate cue type and reason): pt unable to lift upper body up by pushing up with arms Sit to Supine - Left: 4: Min assist Sit to Supine - Left Details (indicate cue type and reason): to lift legs Transfers Sit to Stand: 4: Min assist Sit to Stand Details  (indicate cue type and reason): consistent verbal cues to push up with arms Stand to Sit: 4: Min assist Stand to Sit Details: has trouble contolling speed while descending Ambulation/Gait Ambulation/Gait Assistance: 4: Min assist Ambulation/Gait Assistance Details (indicate cue type and reason): min  assist to direct RW and consistent cues to keep head up and assist with posture Ambulation Distance (Feet): 120 Feet Assistive device: Rolling walker Gait Pattern: Trunk flexed;Decreased stride length (SLOW speed)  Posture/Postural Control Posture/Postural Control: Postural limitations Postural Limitations: Thoracic kyphosis and forward head Exercise  Other Exercises Other Exercises: sit to stand with isometrics and standing balance activity End of Session PT - End of Session Activity Tolerance: Patient tolerated treatment well Patient left: in chair;with family/visitor present General Behavior During Session: [facility] for tasks performed Cognition: [facility] for tasks performed  Donnetta Hail 12/23/2010, 5:04 PM

## 2010-12-23 NOTE — Progress Notes (Signed)
  I called and spoke with patient's son Maziyah Vessel and updated him on pts condition.   [name], MD, CDE, FAAFP [facility] [place], [place]

## 2010-12-24 DIAGNOSIS — E162 Hypoglycemia, unspecified: Secondary | ICD-10-CM | POA: Diagnosis present

## 2010-12-24 LAB — COMPREHENSIVE METABOLIC PANEL
ALT: 6 U/L (ref 0–35)
Alkaline Phosphatase: 107 U/L (ref 39–117)
CO2: 31 mEq/L (ref 19–32)
Calcium: 9.3 mg/dL (ref 8.4–10.5)
Chloride: 106 mEq/L (ref 96–112)
GFR calc Af Amer: 69 mL/min — ABNORMAL LOW (ref >90)
GFR calc non Af Amer: 59 mL/min — ABNORMAL LOW (ref >90)
Glucose, Bld: 65 mg/dL — ABNORMAL LOW (ref 70–99)
Sodium: 142 mEq/L (ref 135–145)
Total Bilirubin: 0.5 mg/dL (ref 0.3–1.2)

## 2010-12-24 LAB — GLUCOSE, CAPILLARY
Glucose-Capillary: 137 mg/dL — ABNORMAL HIGH (ref 70–99)
Glucose-Capillary: 49 mg/dL — ABNORMAL LOW (ref 70–99)
Glucose-Capillary: 135 mg/dL — ABNORMAL HIGH (ref 70–99)
Glucose-Capillary: 143 mg/dL — ABNORMAL HIGH (ref 70–99)

## 2010-12-24 LAB — CBC
Hemoglobin: 11.2 g/dL — ABNORMAL LOW (ref 12.0–15.0)
MCH: 29.3 pg (ref 26.0–34.0)
RBC: 3.82 MIL/uL — ABNORMAL LOW (ref 3.87–5.11)
WBC: 5.7 10*3/uL (ref 4.0–10.5)

## 2010-12-24 MED ORDER — ALBUTEROL SULFATE (5 MG/ML) 0.5% IN NEBU
INHALATION_SOLUTION | RESPIRATORY_TRACT | Status: AC
  Administered 2010-12-24: 2.5 mg via RESPIRATORY_TRACT
  Filled 2010-12-24: qty 0.5

## 2010-12-24 MED ORDER — ALBUTEROL SULFATE (5 MG/ML) 0.5% IN NEBU
2.5000 mg | INHALATION_SOLUTION | Freq: Four times a day (QID) | RESPIRATORY_TRACT | Status: DC
  Administered 2010-12-25: 2.5 mg via RESPIRATORY_TRACT
  Filled 2010-12-24 (×2): qty 0.5

## 2010-12-24 MED ORDER — GLUCOSE 40 % PO GEL
ORAL | Status: AC
  Filled 2010-12-24: qty 1

## 2010-12-24 MED ORDER — GLUCOSE 40 % PO GEL: 1.0000 | Freq: Once | ORAL | Status: DC

## 2010-12-24 MED ORDER — ALBUTEROL SULFATE (5 MG/ML) 0.5% IN NEBU
INHALATION_SOLUTION | RESPIRATORY_TRACT | Status: AC
  Filled 2010-12-24: qty 0.5

## 2010-12-24 MED ORDER — GLUCOSE-VITAMIN C 4-6 GM-MG PO CHEW
CHEWABLE_TABLET | ORAL | Status: AC
  Filled 2010-12-24: qty 1

## 2010-12-24 MED ORDER — ALBUTEROL SULFATE (5 MG/ML) 0.5% IN NEBU: 2.5000 mg | INHALATION_SOLUTION | RESPIRATORY_TRACT | Status: DC | PRN

## 2010-12-24 NOTE — Progress Notes (Signed)
Physical Therapy Treatment Patient Details Name: Vanessa Carroll MRN: [id number] DOB: 03/29/1920 Today's Date: 12/24/2010 11:00-11:40 2G,TE PT Assessment/Plan  PT - Assessment/Plan Comments on Treatment Session: progressing well with gait distance PT Plan: Discharge plan remains appropriate;Frequency remains appropriate PT Goals  Acute Rehab PT Goals PT Transfer Goal: Sit to Stand/Stand to Sit - Progress: Progressing toward goal PT Goal: Ambulate - Progress: Progressing toward goal PT Goal: Perform Home Exercise Program - Progress: Progressing toward goal  PT Treatment Precautions/Restrictions  Restrictions Weight Bearing Restrictions: No Mobility (including Balance) Transfers Sit to Stand: 6: Modified independent (Device/Increase time) Sit to Stand Details (indicate cue type and reason): needs cues to push up with arms Stand to Sit: 6: Modified independent (Device/Increase time) Stand to Sit Details: cues to push up with arms Ambulation/Gait Ambulation/Gait Assistance: 4: Min assist Ambulation/Gait Assistance Details (indicate cue type and reason): cues for posture and safety Ambulation Distance (Feet): 350 Feet (sitting rest break after 300 feet) Assistive device: Rolling walker Gait Pattern: Trunk flexed    Exercise  General Exercises - Upper Extremity Shoulder Extension: Theraband;AROM;10 reps;Seated Theraband Level (Shoulder Extension):  (orange theraband issued) Elbow Extension: Theraband;AROM Theraband Level (Elbow Extension): 10 reps  (orange theraband) Chair Push Up: AROM;Other reps (comment) (3 reps for sit to stand for strength and activity tolerance) General Exercises - Lower Extremity Long Arc Quad: AROM;Both;10 reps;Seated Other Exercises Other Exercises: standing bilateral hip to hip to activage core muscles End of Session PT - End of Session Equipment Utilized During Treatment:  (rolling walker) Activity Tolerance: Patient tolerated treatment well Patient  left: with call bell in reach (in [place], NT aware) Nurse Communication: Mobility status for ambulation General Behavior During Session: [facility] for tasks performed Cognition: [facility] for tasks performed  Donnetta Hail 12/24/2010, 12:00 PM

## 2010-12-24 NOTE — Progress Notes (Signed)
[facility] PROGRESS NOTE   Subjective: Pt had hypoglycemia earlier this morning.  It has been corrected.  Pt didn't have symptoms with the episode.  She says she is feeling better, but shaky and less week.  She has Parkinsons and has a chronic tremor.  She is ambulating the halls and sitting up in a chair today.  She is coughing less and no CP or SOB reported today.  No headaches or rash or n/v/d.   Objective: Filed Vitals:   12/23/10 2208 12/24/10 0551 12/24/10 0819 12/24/10 1143  BP:  154/72    Pulse:  64 62   Temp:  98 F (36.7 C)    TempSrc:  Oral    Resp:  17 16   Height:      Weight:  86.9 kg (191 lb 9.3 oz)    SpO2: 95% 95% 92% 34%   Weight change: 0.6 kg (1 lb 5.2 oz)  Intake/Output Summary (Last 24 hours) at 12/24/10 1339 Last data filed at 12/24/10 1300  Gross per 24 hour  Intake 2689.6 ml  Output   3326 ml  Net -636.4 ml    General: Alert, awake, oriented x3, in no acute distress.  HEENT: No bruits, no goiter.  Heart: Regular rate and rhythm, without murmurs, rubs, gallops.  Lungs: no crackles or wheezes Abdomen: Soft, nontender, nondistended, positive bowel sounds.  Neuro: Grossly intact, nonfocal.  REVIEW OF SYSTEMS: As in Subjective above otherwise all reviewed and negative  Lab Results:  [facility] 12/24/10 0332 12/23/10 0405  NA 142 142  K 4.1 3.3*  CL 106 106  CO2 31 28  GLUCOSE 65* 68*  BUN 13 11  CREATININE 0.85 0.71  CALCIUM 9.3 8.7  MG -- --  PHOS -- --    Basename 12/24/10 0332 12/22/10 0345  AST 18 19  ALT 6 <5  ALKPHOS 107 98  BILITOT 0.5 0.6  PROT 5.5* 5.4*  ALBUMIN 3.1* 3.0*   No results found for this basename: LIPASE:2,AMYLASE:2 in the last 72 hours  Basename 12/24/10 0332 12/22/10 0345  WBC 5.7 5.5  NEUTROABS -- 3.5  HGB 11.2* 11.4*  HCT 34.8* 35.5*  MCV 91.1 92.2  PLT 109* 119*     Basename 12/23/10 0405  HGBA1C 5.4    Basename 12/22/10 0345  CHOL 157  HDL 24*  LDLCALC 102*  TRIG 155*  CHOLHDL 6.5    LDLDIRECT --    Basename 12/22/10 0345  TSH 1.284  T4TOTAL --  T3FREE --  THYROIDAB --   No results found for this basename: VITAMINB12:2,FOLATE:2,FERRITIN:2,TIBC:2,IRON:2,RETICCTPCT:2 in the last 72 hours  Micro Results: Recent Results (from the past 240 hour(s))  URINE CULTURE     Status: Normal   Collection Time   12/21/10 11:43 AM      Component Value Range Status Comment   Specimen Description URINE, CATHETERIZED   Final    Special Requests NONE   Final    Setup Time [id number]   Final    Colony Count NO GROWTH   Final    Culture NO GROWTH   Final    Report Status 12/22/2010 FINAL   Final   CULTURE, BLOOD (ROUTINE X 2)     Status: Normal (Preliminary result)   Collection Time   12/21/10 12:07 PM      Component Value Range Status Comment   Specimen Description BLOOD RIGHT ANTECUBITAL   Final    Special Requests BOTTLES DRAWN AEROBIC AND ANAEROBIC [facility] EACH   Final  Setup Time [id number]   Final    Culture     Final    Value:        BLOOD CULTURE RECEIVED NO GROWTH TO DATE CULTURE WILL BE HELD FOR 5 DAYS BEFORE ISSUING A FINAL NEGATIVE REPORT   Report Status PENDING   Incomplete   CULTURE, BLOOD (ROUTINE X 2)     Status: Normal (Preliminary result)   Collection Time   12/21/10 12:54 PM      Component Value Range Status Comment   Specimen Description BLOOD LEFT HAND   Final    Special Requests BOTTLES DRAWN AEROBIC ONLY   Final    Setup Time [id number]   Final    Culture     Final    Value:        BLOOD CULTURE RECEIVED NO GROWTH TO DATE CULTURE WILL BE HELD FOR 5 DAYS BEFORE ISSUING A FINAL NEGATIVE REPORT   Report Status PENDING   Incomplete     Studies/Results: No results found.  Medications: I have reviewed the patient's current medications.  Assessment and Plan  Patient Active Hospital Problem List: Asthma exacerbation (12/23/2010)   Assessment: improving   Plan: symptoms improving, continue current therapy  Type II or unspecified type diabetes  mellitus without mention of complication, not stated as uncontrolled (12/23/2010)   Assessment: Concerned about hypoglycemia   Plan: decreased insulin doses, continue 0300 blood sugar tests  CLL (chronic lymphocytic leukemia) (12/23/2010)   Assessment: Stable  Parkinson disease (12/23/2010)   Assessment: unchanged    Plan: Continue current meds  Physical deconditioning (12/23/2010)   Assessment: improving   Plan: Continue PT.  Hopefully home tomorrow if stable.  Hypokalemia - resolved  [name], MD, CDE, FAAFP [facility] [place], [place]     LOS: 3 days   Vanessa Carroll 12/24/2010, 1:39 PM  [name], MD, CDE, FAAFP [facility] [place], [place]

## 2010-12-25 LAB — GLUCOSE, CAPILLARY
Glucose-Capillary: 116 mg/dL — ABNORMAL HIGH (ref 70–99)
Glucose-Capillary: 133 mg/dL — ABNORMAL HIGH (ref 70–99)
Glucose-Capillary: 140 mg/dL — ABNORMAL HIGH (ref 70–99)
Glucose-Capillary: 78 mg/dL (ref 70–99)

## 2010-12-25 LAB — COMPREHENSIVE METABOLIC PANEL
BUN: 17 mg/dL (ref 6–23)
CO2: 30 mEq/L (ref 19–32)
Calcium: 9.2 mg/dL (ref 8.4–10.5)
Creatinine, Ser: 0.79 mg/dL (ref 0.50–1.10)
GFR calc Af Amer: 84 mL/min — ABNORMAL LOW (ref >90)
GFR calc non Af Amer: 72 mL/min — ABNORMAL LOW (ref >90)
Glucose, Bld: 89 mg/dL (ref 70–99)
Total Protein: 5.4 g/dL — ABNORMAL LOW (ref 6.0–8.3)

## 2010-12-25 MED ORDER — SITAGLIPTIN PHOSPHATE 100 MG PO TABS: 100.0000 mg | ORAL_TABLET | Freq: Every day | ORAL | Status: DC

## 2010-12-25 MED ORDER — LEVOFLOXACIN 500 MG PO TABS: 500.0000 mg | ORAL_TABLET | Freq: Every day | ORAL | Status: AC

## 2010-12-25 NOTE — Progress Notes (Deleted)
.  last

## 2010-12-25 NOTE — Discharge Summary (Signed)
DISCHARGE SUMMARY  Vanessa Carroll  MR#: [id number]  DOB:25-Nov-1920  Date of Admission: 12/21/2010 Date of Discharge: 12/25/2010  Attending Physician:Clanford Johnson  Patient's WUJ:WJXB,JYNWG, MD  Consults: None  Discharge Diagnoses: Present on Admission:  .Asthma exacerbation .Type II or unspecified type diabetes mellitus without mention of complication, not stated as uncontrolled .CLL (chronic lymphocytic leukemia) .Parkinson disease .Physical deconditioning .Hypoglycemia    Current Discharge Medication List    START taking these medications   Details  levofloxacin (LEVAQUIN) 500 MG tablet Take 1 tablet (500 mg total) by mouth daily. Qty: 4 tablet, Refills: 0   Associated Diagnoses: UTI (lower urinary tract infection)    sitaGLIPtin (JANUVIA) 100 MG tablet Take 1 tablet (100 mg total) by mouth daily. Qty: 30 tablet, Refills: 0   Associated Diagnoses: Type II or unspecified type diabetes mellitus without mention of complication, not stated as uncontrolled      CONTINUE these medications which have NOT CHANGED   Details  atenolol (TENORMIN) 50 MG tablet Take 50 mg by mouth 2 (two) times daily.      benztropine (COGENTIN) 0.5 MG tablet Take 0.5 mg by mouth daily.      carbidopa-levodopa (SINEMET) 25-250 MG per tablet Take 2 tablets by mouth 3 (three) times daily.      Coenzyme Q10 100 MG capsule Take 100 mg by mouth 2 (two) times daily.      cyanocobalamin (,VITAMIN B-12,) 1000 MCG/ML injection Inject 1,000 mcg into the muscle every 30 (thirty) days.      gabapentin (NEURONTIN) 300 MG capsule Take 300-600 mg by mouth 2 (two) times daily. Takes 1 in the morning and 2 in the evening     levothyroxine (SYNTHROID, LEVOTHROID) 100 MCG tablet Take 100 mcg by mouth as directed. Takes 1 tablet on mondays, wednesdays and fridays     losartan (COZAAR) 100 MG tablet Take 100 mg by mouth at bedtime.      potassium chloride (KLOR-CON) 10 MEQ CR tablet Take 10 mEq by mouth every  morning.      sertraline (ZOLOFT) 25 MG tablet Take 25-50 mg by mouth 2 (two) times daily. Takes 2 tablets in the morning and 1 in the evening.     torsemide (DEMADEX) 10 MG tablet Take 10 mg by mouth every morning.        STOP taking these medications     glimepiride (AMARYL) 4 MG tablet        Diet - Low Sodium, Carbohydrate consistent diabetic  Activity - Ad [facility] Course: Present on Admission:  .Asthma exacerbation .Type II or unspecified type diabetes mellitus without mention of complication, not stated as uncontrolled .CLL (chronic lymphocytic leukemia) .Parkinson disease .Physical deconditioning .Hypoglycemia:secondary to sulfonylureas   Day of Discharge BP 166/85  Pulse 65  Temp(Src) 97.7 F (36.5 C) (Oral)  Resp 20  Ht 5\' 7"  (1.702 m)  Wt 86.5 kg (190 lb 11.2 oz)  BMI 29.87 kg/m2  SpO2 94%  Physical Exam: Gen - Pt awake, sitting up in chair, no distress Heent - MMM Lungs - BBS CTA, no wheezing heard CV - nl S1 S2 sounds, no m/r/g Abd - soft, +BS, nd/nt Ext - no cyanosis, mild bilateral pretibial edema Neuro - resting tremor noted, parkinsonian stance, no focal findings   Results for orders placed during the hospital encounter of 12/21/10 (from the past 24 hour(s))  GLUCOSE, CAPILLARY     Status: Abnormal   Collection Time   12/24/10  6:33 PM  Component Value Range   Glucose-Capillary 133 (*) 70 - 99 (mg/dL)   Comment 1 Notify RN    GLUCOSE, CAPILLARY     Status: Abnormal   Collection Time   12/25/10 12:15 AM      Component Value Range   Glucose-Capillary 136 (*) 70 - 99 (mg/dL)   Comment 1 Notify RN    COMPREHENSIVE METABOLIC PANEL     Status: Abnormal   Collection Time   12/25/10  4:20 AM      Component Value Range   Sodium 139  135 - 145 (mEq/L)   Potassium 3.8  3.5 - 5.1 (mEq/L)   Chloride 101  96 - 112 (mEq/L)   CO2 30  19 - 32 (mEq/L)   Glucose, Bld 89  70 - 99 (mg/dL)   BUN 17  6 - 23 (mg/dL)   Creatinine, Ser 1.61  0.50  - 1.10 (mg/dL)   Calcium 9.2  8.4 - 09.6 (mg/dL)   Total Protein 5.4 (*) 6.0 - 8.3 (g/dL)   Albumin 3.0 (*) 3.5 - 5.2 (g/dL)   AST 18  0 - 37 (U/L)   ALT <5  0 - 35 (U/L)   Alkaline Phosphatase 113  39 - 117 (U/L)   Total Bilirubin 0.5  0.3 - 1.2 (mg/dL)   GFR calc non Af Amer 72 (*) >90 (mL/min)   GFR calc Af Amer 84 (*) >90 (mL/min)  GLUCOSE, CAPILLARY     Status: Normal   Collection Time   12/25/10  4:49 AM      Component Value Range   Glucose-Capillary 81  70 - 99 (mg/dL)   Comment 1 Notify RN    GLUCOSE, CAPILLARY     Status: Abnormal   Collection Time   12/25/10  8:11 AM      Component Value Range   Glucose-Capillary 140 (*) 70 - 99 (mg/dL)   Comment 1 Documented in Chart     Comment 2 Notify RN    GLUCOSE, CAPILLARY     Status: Abnormal   Collection Time   12/25/10 11:55 AM      Component Value Range   Glucose-Capillary 128 (*) 70 - 99 (mg/dL)   Comment 1 Documented in Chart     Comment 2 Notify RN    GLUCOSE, CAPILLARY     Status: Normal   Collection Time   12/25/10  1:17 PM      Component Value Range   Glucose-Capillary 78  70 - 99 (mg/dL)   Comment 1 Documented in Chart     Comment 2 Notify RN      Disposition: Pt to be discharged to Independent living facility with home health and home PT.    Follow-up Appts: Discharge Orders    Future Appointments: Provider: Department: Dept Phone: Center:   01/02/2011 9:30 AM Leighton Ruff Womack [facility] [phone] None   01/02/2011 10:00 AM Lowella Dell, MD [facility] [phone] None     Future Orders Please Complete By Expires   Diet - low sodium heart healthy      Increase activity slowly      Discharge instructions      Comments:   Please be sure to fill your prescription for levofloxacin and finish the prescription. Return for worsening shortness of breath, weakness, chest pain, falling down, or any new changes.  Please be careful and avoid falling down.  Check your blood pressure at least once per  day and report any high blood pressures  to your primary care physician Check blood sugar at least one time per day.  Write down your blood sugars and report them to your primary care physician.   Call MD for:  persistant nausea and vomiting      Call MD for:  severe uncontrolled pain      Call MD for:  difficulty breathing, headache or visual disturbances      Call MD for:  persistant dizziness or light-headedness         Follow-up with Dr.Allen Ross in 1 week. Follow up with Dr. Paulina Fusi your neurologist in 3-4 weeks  Keep a close watch on your blood pressure and blood sugars.  Report any abnormal readings to your primary care physician Dr. Donovan Kail.    I spent 65 mins preparing today's discharge.     Signed: Clanford Johnson 12/25/2010, 1:55 PM

## 2010-12-27 LAB — CULTURE, BLOOD (ROUTINE X 2)
Culture: NO GROWTH
Culture: NO GROWTH

## 2011-01-02 ENCOUNTER — Other Ambulatory Visit: Payer: Medicare Other | Admitting: Lab

## 2011-01-02 ENCOUNTER — Ambulatory Visit: Payer: Medicare Other | Admitting: Oncology

## 2011-01-10 ENCOUNTER — Other Ambulatory Visit: Payer: Self-pay | Admitting: Oncology

## 2011-01-10 DIAGNOSIS — D472 Monoclonal gammopathy: Secondary | ICD-10-CM

## 2011-01-10 DIAGNOSIS — C9110 Chronic lymphocytic leukemia of B-cell type not having achieved remission: Secondary | ICD-10-CM

## 2011-01-11 ENCOUNTER — Telehealth: Payer: Self-pay | Admitting: *Deleted

## 2011-01-11 NOTE — Telephone Encounter (Signed)
Mailed out letter and calendar to inform the patient of the new date and time of the appointment with Zollie Scale in 03-2011

## 2011-04-01 ENCOUNTER — Other Ambulatory Visit: Payer: Medicare Other | Admitting: Lab

## 2011-04-08 ENCOUNTER — Ambulatory Visit: Payer: Medicare Other | Admitting: Physician Assistant

## 2011-04-23 ENCOUNTER — Other Ambulatory Visit: Payer: Self-pay | Admitting: Internal Medicine

## 2011-04-23 ENCOUNTER — Ambulatory Visit
Admission: RE | Admit: 2011-04-23 | Discharge: 2011-04-23 | Disposition: A | Discharge status: Home / Self Care | Payer: No Typology Code available for payment source | Source: Ambulatory Visit | Attending: Internal Medicine | Admitting: Internal Medicine

## 2011-04-25 ENCOUNTER — Other Ambulatory Visit: Payer: Self-pay | Admitting: Internal Medicine

## 2011-04-29 ENCOUNTER — Other Ambulatory Visit: Payer: No Typology Code available for payment source

## 2011-05-03 ENCOUNTER — Other Ambulatory Visit: Payer: No Typology Code available for payment source

## 2011-05-28 ENCOUNTER — Ambulatory Visit
Admission: RE | Admit: 2011-05-28 | Discharge: 2011-05-28 | Disposition: A | Discharge status: Home / Self Care | Payer: No Typology Code available for payment source | Source: Ambulatory Visit | Attending: Internal Medicine | Admitting: Internal Medicine

## 2012-02-25 ENCOUNTER — Emergency Department (HOSPITAL_COMMUNITY): Payer: Medicare Other

## 2012-02-25 ENCOUNTER — Encounter (HOSPITAL_COMMUNITY): Payer: Self-pay

## 2012-02-25 ENCOUNTER — Emergency Department (HOSPITAL_COMMUNITY)
Admission: EM | Admit: 2012-02-25 | Discharge: 2012-02-25 | Disposition: A | Discharge status: Home / Self Care | Payer: Medicare Other | Attending: Emergency Medicine | Admitting: Emergency Medicine

## 2012-02-25 DIAGNOSIS — C9110 Chronic lymphocytic leukemia of B-cell type not having achieved remission: Secondary | ICD-10-CM | POA: Insufficient documentation

## 2012-02-25 DIAGNOSIS — Z8739 Personal history of other diseases of the musculoskeletal system and connective tissue: Secondary | ICD-10-CM | POA: Insufficient documentation

## 2012-02-25 DIAGNOSIS — G20 Parkinson's disease: Secondary | ICD-10-CM | POA: Insufficient documentation

## 2012-02-25 DIAGNOSIS — Z8669 Personal history of other diseases of the nervous system and sense organs: Secondary | ICD-10-CM | POA: Insufficient documentation

## 2012-02-25 DIAGNOSIS — Z79899 Other long term (current) drug therapy: Secondary | ICD-10-CM | POA: Insufficient documentation

## 2012-02-25 DIAGNOSIS — G20A1 Parkinson's disease without dyskinesia, without mention of fluctuations: Secondary | ICD-10-CM | POA: Insufficient documentation

## 2012-02-25 DIAGNOSIS — I10 Essential (primary) hypertension: Secondary | ICD-10-CM | POA: Insufficient documentation

## 2012-02-25 DIAGNOSIS — M5412 Radiculopathy, cervical region: Secondary | ICD-10-CM

## 2012-02-25 DIAGNOSIS — E119 Type 2 diabetes mellitus without complications: Secondary | ICD-10-CM | POA: Insufficient documentation

## 2012-02-25 HISTORY — DX: Spinal stenosis, lumbar region without neurogenic claudication: M48.061

## 2012-02-25 HISTORY — DX: Chronic lymphocytic leukemia of B-cell type not having achieved remission: C91.10

## 2012-02-25 HISTORY — DX: Essential (primary) hypertension: I10

## 2012-02-25 HISTORY — DX: Type 2 diabetes mellitus without complications: E11.9

## 2012-02-25 HISTORY — DX: Unspecified abnormalities of gait and mobility: R26.9

## 2012-02-25 HISTORY — DX: Parkinson's disease: G20

## 2012-02-25 HISTORY — DX: Polyneuropathy, unspecified: G62.9

## 2012-02-25 LAB — BASIC METABOLIC PANEL
BUN: 38 mg/dL — ABNORMAL HIGH (ref 6–23)
CO2: 30 mEq/L (ref 19–32)
Chloride: 100 mEq/L (ref 96–112)
Creatinine, Ser: 1.05 mg/dL (ref 0.50–1.10)
GFR calc Af Amer: 53 mL/min — ABNORMAL LOW (ref >90)
Glucose, Bld: 117 mg/dL — ABNORMAL HIGH (ref 70–99)
Potassium: 4.3 mEq/L (ref 3.5–5.1)

## 2012-02-25 LAB — CBC
HCT: 36.9 % (ref 36.0–46.0)
Hemoglobin: 12.3 g/dL (ref 12.0–15.0)
MCV: 96.6 fL (ref 78.0–100.0)
RDW: 13.4 % (ref 11.5–15.5)
WBC: 7 10*3/uL (ref 4.0–10.5)

## 2012-02-25 MED ORDER — MORPHINE SULFATE 4 MG/ML IJ SOLN
2.0000 mg | Freq: Once | INTRAMUSCULAR | Status: AC
  Administered 2012-02-25: 2 mg via INTRAVENOUS
  Filled 2012-02-25: qty 1

## 2012-02-25 MED ORDER — ONDANSETRON HCL 4 MG/2ML IJ SOLN
4.0000 mg | Freq: Once | INTRAMUSCULAR | Status: AC
  Administered 2012-02-25: 4 mg via INTRAVENOUS
  Filled 2012-02-25: qty 2

## 2012-02-25 MED ORDER — HYDROCODONE-ACETAMINOPHEN 5-325 MG PO TABS: 2.0000 | ORAL_TABLET | Freq: Four times a day (QID) | ORAL | Status: DC | PRN

## 2012-02-25 NOTE — ED Notes (Signed)
Called patient son Dorene Sorrow, he will be coming to pick her up in 30-45 mins. Pt will be taken back to [facility]. Ordered patient breakfast tray.

## 2012-02-25 NOTE — ED Provider Notes (Signed)
History     CSN: [id number]  Arrival date & time 02/25/12  0536   First MD Initiated Contact with Patient 02/25/12 [phone]      Chief Complaint  Patient presents with  . Chest Pain    (Consider location/radiation/quality/duration/timing/severity/associated sxs/prior treatment) HPI Comments: This is an 79 year old female, who presents emergency department with chief complaint of chest pain and left arm pain. Patient states that the pain woke her from sleep at 4:00 this morning. She states that the chest pain has sent subsided, but that the arm pain is 7/10. She denies shortness of breath, diaphoresis, nausea, vomiting, and abdominal pain. She has received aspirin at her care home, and sublingual nitroglycerin from EMS with no change in her symptoms. She states that she has had this pain before approximately 2 weeks ago, but that it was less severe, so she ignored it. Additionally she complains of having a cold and chest congestion for the past week or so. There are no aggravating or alleviating factors.  The history is provided by the patient. No language interpreter was used.    Past Medical History  Diagnosis Date  . Diabetes mellitus without complication   . Leukemia, chronic lymphoid   . Paralysis agitans   . Neuropathy   . Hypertension   . Lumbar stenosis   . Abnormal gait   . Parkinson disease     No past surgical history on file.  No family history on file.  History  Substance Use Topics  . Smoking status: Never Smoker   . Smokeless tobacco: Never Used  . Alcohol Use: No    OB History    Grav Para Term Preterm Abortions TAB SAB Ect Mult Living                  Review of Systems  All other systems reviewed and are negative.    Allergies  Codeine and Sulfa antibiotics  Home Medications   Current Outpatient Rx  Name  Route  Sig  Dispense  Refill  . ATENOLOL 50 MG PO TABS   Oral   Take 50 mg by mouth 2 (two) times daily.           Marland Kitchen BENZTROPINE  MESYLATE 0.5 MG PO TABS   Oral   Take 0.5 mg by mouth daily.           Marland Kitchen CARBIDOPA-LEVODOPA 25-250 MG PO TABS   Oral   Take 2 tablets by mouth 3 (three) times daily.           Marland Kitchen COENZYME Q10 100 MG PO CAPS   Oral   Take 100 mg by mouth 2 (two) times daily.           . CYANOCOBALAMIN 1000 MCG/ML IJ SOLN   Intramuscular   Inject 1,000 mcg into the muscle every 30 (thirty) days.           Marland Kitchen GABAPENTIN 300 MG PO CAPS   Oral   Take 300-600 mg by mouth 2 (two) times daily. Takes 1 in the morning and 2 in the evening          . LEVOTHYROXINE SODIUM 100 MCG PO TABS   Oral   Take 100 mcg by mouth as directed. Takes 1 tablet on mondays, wednesdays and fridays          . LOSARTAN POTASSIUM 100 MG PO TABS   Oral   Take 100 mg by mouth at bedtime.           Marland Kitchen  POTASSIUM CHLORIDE 10 MEQ PO TBCR   Oral   Take 10 mEq by mouth every morning.           Marland Kitchen SERTRALINE HCL 25 MG PO TABS   Oral   Take 25-50 mg by mouth 2 (two) times daily. Takes 2 tablets in the morning and 1 in the evening.          Marland Kitchen SITAGLIPTIN PHOSPHATE 100 MG PO TABS   Oral   Take 1 tablet (100 mg total) by mouth daily.   30 tablet   0   . TORSEMIDE 10 MG PO TABS   Oral   Take 10 mg by mouth every morning.             BP 154/54  Temp 98.2 F (36.8 C) (Oral)  Resp 14  SpO2 96%  Physical Exam  Nursing note and vitals reviewed. Constitutional: She is oriented to person, place, and time. She appears well-developed and well-nourished.  HENT:  Head: Normocephalic and atraumatic.  Eyes: Conjunctivae normal and EOM are normal. Pupils are equal, round, and reactive to light.  Neck: Normal range of motion. Neck supple.  Cardiovascular: Normal rate and regular rhythm.  Exam reveals no gallop and no friction rub.   No murmur heard. Pulmonary/Chest: Effort normal and breath sounds normal. No respiratory distress. She has no wheezes. She has no rales. She exhibits no tenderness.  Abdominal: Soft.  Bowel sounds are normal. She exhibits no distension and no mass. There is no tenderness. There is no rebound and no guarding.  Musculoskeletal: Normal range of motion. She exhibits no edema and no tenderness.  Neurological: She is alert and oriented to person, place, and time.  Skin: Skin is warm and dry.  Psychiatric: She has a normal mood and affect. Her behavior is normal. Judgment and thought content normal.    ED Course  Procedures (including critical care time)  Results for orders placed during the hospital encounter of 02/25/12  CBC      Component Value Range   WBC 7.0  4.0 - 10.5 K/uL   RBC 3.82 (*) 3.87 - 5.11 MIL/uL   Hemoglobin 12.3  12.0 - 15.0 g/dL   HCT 11.9  14.7 - 82.9 %   MCV 96.6  78.0 - 100.0 fL   MCH 32.2  26.0 - 34.0 pg   MCHC 33.3  30.0 - 36.0 g/dL   RDW 56.2  13.0 - 86.5 %   Platelets 105 (*) 150 - 400 K/uL  BASIC METABOLIC PANEL      Component Value Range   Sodium 141  135 - 145 mEq/L   Potassium 4.3  3.5 - 5.1 mEq/L   Chloride 100  96 - 112 mEq/L   CO2 30  19 - 32 mEq/L   Glucose, Bld 117 (*) 70 - 99 mg/dL   BUN 38 (*) 6 - 23 mg/dL   Creatinine, Ser 7.84  0.50 - 1.10 mg/dL   Calcium 9.4  8.4 - 69.6 mg/dL   GFR calc non Af Amer 46 (*) >90 mL/min   GFR calc Af Amer 53 (*) >90 mL/min  POCT I-STAT TROPONIN I      Component Value Range   Troponin i, poc 0.01  0.00 - 0.08 ng/mL   Comment 3            Dg Cervical Spine Complete  02/25/2012  *RADIOLOGY REPORT*  Clinical Data: Neck pain.  No injury.  CERVICAL SPINE - COMPLETE 4+ VIEW  Comparison: CT 02/05/2006.  Findings: Degenerative disc disease from C5-6 through C7-T1 with disc space narrowing and anterior osteophytes.  Normal alignment. Prevertebral soft tissues are normal.  No fracture.  Mild bilateral neural foraminal narrowing at C4-5 due to facet disease and uncovertebral spurring.  No fracture.  IMPRESSION: Degenerative changes.  No acute findings.   Original Report Authenticated By: Charlett Nose, M.D.      Dg Chest Port 1 View  02/25/2012  *RADIOLOGY REPORT*  Clinical Data: Left chest pain.  Shortness of breath.  PORTABLE CHEST - 1 VIEW  Comparison: PA and lateral chest 12/21/2010.  Findings: There is cardiomegaly but no pulmonary edema.  Lungs are clear.  No pneumothorax or pleural fluid.  Severe degenerative disease left shoulder noted.  IMPRESSION: Cardiomegaly without acute disease.   Original Report Authenticated By: Holley Dexter, M.D.          ED ECG REPORT  I personally interpreted this EKG   Date: 02/25/2012   Rate: 64  Rhythm: normal sinus rhythm  QRS Axis: left  Intervals: normal  ST/T Wave abnormalities: nonspecific ST changes  Conduction Disutrbances:left bundle branch block  Narrative Interpretation: Sgarbossa criteria is met  Old EKG Reviewed: none available    1. Cervical radiculopathy       MDM  79 year old female with chest pain.  She has received ASA 325 and SL Nitro from EMS.  I will administer Morphine and O2.  Routine labs and chest xray have been ordered.  7:00 AM Patient reports that the Morphine did not help her arm pain.  Denies chest pain at this time, but states that she feels short of breath.  9:08 AM This patient was seen by and discussed with Dr. Rulon Abide.  We believe the patient's symptoms to be related to a cervical radiculopathy.  Currently the patient states that her pain is her neck and radiates down her left arm with some tingling and subjective numbness.  Workup for acute chest syndrome has been negative today.  Will treat the patient with Vicodin and have the patient follow-up with her PCP.        Roxy Horseman, PA-C 02/25/12 [phone]

## 2012-02-25 NOTE — ED Notes (Signed)
Pt son and Romeo Rabon [phone].

## 2012-02-25 NOTE — ED Notes (Signed)
Patient taken out in wheelchair with son,

## 2012-02-25 NOTE — ED Notes (Signed)
Patient presents from [facility]) with cc: midchest pain that radiates to the left arm. Onset 0400. Woke patient up from sleep. Rates CP 2/10 and arm pain 7/10. ASA 325 given at SNF. EMS administered Nitro SL x1 with no change in pain. No SOB, diaphoresis, n/v or abd pain.

## 2012-02-25 NOTE — ED Notes (Signed)
Called [facility] spoke with pt RN, notified pt coming back to facility with son.

## 2012-02-25 NOTE — ED Notes (Signed)
No old EKG.

## 2012-02-27 NOTE — ED Provider Notes (Signed)
Medical screening examination/treatment/procedure(s) were conducted as a shared visit with non-physician practitioner(s) and myself.  I personally evaluated the patient during the encounter Jones Skene, M.D.  Vanessa Carroll is a 79 y.o. female presenting with left arm pain. To me, she is not complaining about any chest pain this is all localized to the left arm. It starts at the top of her shoulder and shoots down into her arm, it is moderate to severe, it feels like electric shocks, and sharp and stabbing. She denies any shortness of breath, diaphoresis, nausea, vomiting, diarrhea, abdominal pain. Patient received medicine from EMS including nitroglycerin and to aspirin at home. This did not change any of her symptoms. She's had this pain in her arm before but was not quite as severe. Patient has had recent URI type symptoms recently.   VITAL SIGNS:   Filed Vitals:   02/25/12 1031  BP: 146/90  Pulse: 68  Temp:   Resp: 20   CONSTITUTIONAL: Awake, oriented, appears non-toxic HENT: Atraumatic, normocephalic, oral mucosa pink and moist, airway patent. Nares patent without drainage. External ears normal. EYES: Conjunctiva clear, EOMI, PERRLA NECK: Trachea midline, non-tender, supple CARDIOVASCULAR: Normal heart rate, Normal rhythm, No murmurs, rubs, gallops PULMONARY/CHEST: Clear to auscultation, no rhonchi, wheezes, or rales. Symmetrical breath sounds. Non-tender. ABDOMINAL: Non-distended, soft, non-tender - no rebound or guarding.  BS normal. NEUROLOGIC: Non-focal, moving all four extremities, no gross sensory or motor deficits.  EXTREMITIES: No clubbing, cyanosis, or edema SKIN: Warm, Dry, No erythema, No rash  Dg Cervical Spine Complete  02/25/2012  *RADIOLOGY REPORT*  Clinical Data: Neck pain.  No injury.  CERVICAL SPINE - COMPLETE 4+ VIEW  Comparison: CT 02/05/2006.  Findings: Degenerative disc disease from C5-6 through C7-T1 with disc space narrowing and anterior osteophytes.  Normal  alignment. Prevertebral soft tissues are normal.  No fracture.  Mild bilateral neural foraminal narrowing at C4-5 due to facet disease and uncovertebral spurring.  No fracture.  IMPRESSION: Degenerative changes.  No acute findings.   Original Report Authenticated By: Charlett Nose, M.D.      Vanessa Carroll is a 79 y.o. female presenting with left arm pain. I feel this is most likely a cervical radiculopathy, I do not think this is acute coronary syndrome especially with negative enzymes and a nonacute EKG. Discharged to home with conservative pain management measures. Followup with primary care physician Dr.     Jones Skene, MD 02/27/12 [phone]

## 2012-03-05 ENCOUNTER — Other Ambulatory Visit: Payer: Self-pay | Admitting: Dermatology

## 2012-08-26 LAB — CBC AND DIFFERENTIAL
HCT: 39 % (ref 36–46)
Hemoglobin: 13.5 g/dL (ref 12.0–16.0)
WBC: 6.5 10^3/mL

## 2012-08-26 LAB — BASIC METABOLIC PANEL: Glucose: 111 mg/dL

## 2012-08-26 LAB — HEPATIC FUNCTION PANEL
ALT: 8 U/L (ref 7–35)
AST: 14 U/L (ref 13–35)
Alkaline Phosphatase: 99 U/L (ref 25–125)

## 2012-08-26 LAB — LIPID PANEL
HDL: 31 mg/dL — AB (ref 35–70)
LDL Cholesterol: 121 mg/dL

## 2012-08-31 ENCOUNTER — Encounter: Payer: Self-pay | Admitting: Nurse Practitioner

## 2012-08-31 ENCOUNTER — Non-Acute Institutional Stay: Payer: Medicare Other | Admitting: Nurse Practitioner

## 2012-08-31 DIAGNOSIS — G629 Polyneuropathy, unspecified: Secondary | ICD-10-CM | POA: Insufficient documentation

## 2012-08-31 DIAGNOSIS — G20 Parkinson's disease: Secondary | ICD-10-CM

## 2012-08-31 DIAGNOSIS — E039 Hypothyroidism, unspecified: Secondary | ICD-10-CM

## 2012-08-31 DIAGNOSIS — G609 Hereditary and idiopathic neuropathy, unspecified: Secondary | ICD-10-CM

## 2012-08-31 DIAGNOSIS — E119 Type 2 diabetes mellitus without complications: Secondary | ICD-10-CM

## 2012-08-31 DIAGNOSIS — M1990 Unspecified osteoarthritis, unspecified site: Secondary | ICD-10-CM

## 2012-08-31 DIAGNOSIS — R635 Abnormal weight gain: Secondary | ICD-10-CM

## 2012-08-31 DIAGNOSIS — R609 Edema, unspecified: Secondary | ICD-10-CM | POA: Insufficient documentation

## 2012-08-31 DIAGNOSIS — I10 Essential (primary) hypertension: Secondary | ICD-10-CM

## 2012-08-31 NOTE — Assessment & Plan Note (Addendum)
Weights in 2014: Jan # 211, Feb #214, Mar #210, Apr #213, May #211, Jun #214, July #218--denied SOB, orthopnea, bathroom trip average x1/night, chronic BLE edema 1+ wearing compression hosiery. The patient and her POA-son are concerned of the patient's waistline--LFT wnl 08/26/12 and denied abd pain/discomofrt or N/V/D--will obtain US abd and pelvis to evaluate further to r/o ascites. The patient and her POA-son desire comfort feeding instead of ADA diet.

## 2012-08-31 NOTE — Assessment & Plan Note (Signed)
Chronic BLE, 1+, compression hosiery, takes Torsemide 50mg  daily, Bun/creat 30/0.93 08/27/12

## 2012-08-31 NOTE — Assessment & Plan Note (Signed)
Takes Levothyroxine , TSH 1.499 08/27/12

## 2012-08-31 NOTE — Assessment & Plan Note (Signed)
Stiffness and aches in multiple sites--takes Tylenol 650mg  qam to get her day started.

## 2012-08-31 NOTE — Assessment & Plan Note (Signed)
Burning sensation in her feet--able to sleep through night 9pm-7am with x1 bathroom trip in average. Takes Gabapentin 600mg bid.    

## 2012-08-31 NOTE — Progress Notes (Signed)
Patient ID: Vanessa Carroll, female   DOB: 06-05-1920, 79 y.o.   MRN: [id number] Code Status: DNR  Allergies  Allergen Reactions  . Codeine Nausea And Vomiting  . Sulfa Antibiotics Swelling    Chief Complaint  Patient presents with  . Medical Managment of Chronic Issues    weight gain, BLE edema, increased abd girth.     HPI: Patient is a 79 y.o. female seen in the [facility] at [facility] today for evaluation of her chronic medical conditions and formulating/coordinating plan of her care(Son and staff all presented) Problem List Items Addressed This Visit   Edema     Chronic BLE, 1+, compression hosiery, takes Torsemide 50mg  daily, Bun/creat 30/0.93 08/27/12    HTN (hypertension)     Controlled on Doxazosin 4mg  qd, Torsemide 50mg  qd, Atenolol 50mg  bid, and Losartan 100mg  daily    Osteoarthritis     Stiffness and aches in multiple sites--takes Tylenol 650mg  qam to get her day started.     Parkinson disease (Chronic)     Chin and hand tremor observed but not disabling, takes Sinemet 25/250mg  tid. Walks to and from dinning room for her room with walker x3/day    Peripheral neuropathy     Burning sensation in her feet--able to sleep through night 9pm-7am with x1 bathroom trip in average. Takes Gabapentin 600mg  bid.     Type II or unspecified type diabetes mellitus without mention of complication, not stated as uncontrolled (Chronic)     Hgb A1c 6.2 08/27/12, continue Glimepiride 2mg  daily    Unspecified hypothyroidism     Takes Levothyroxine , TSH 1.499 08/27/12    Weight gain - Primary     Weights in 2014: Jan # 211, Feb #214, Mar #210, Apr #213, May #211, Jun #214, July #218--denied SOB, orthopnea, bathroom trip average x1/night, chronic BLE edema 1+ wearing compression hosiery. The patient and her POA-son are concerned of the patient's waistline--LFT wnl 08/26/12 and denied abd pain/discomofrt or N/V/D--will obtain US abd and pelvis to evaluate further to r/o ascites. The  patient and her POA-son desire comfort feeding instead of ADA diet.        Review of Systems:  Review of Systems  Constitutional: Positive for weight loss (weight gain). Negative for fever, chills, malaise/fatigue and diaphoresis.  HENT: Positive for hearing loss (mild). Negative for ear pain, nosebleeds, congestion, sore throat, neck pain, tinnitus and ear discharge.   Eyes: Negative for blurred vision, double vision, photophobia, pain, discharge and redness.  Respiratory: Positive for cough (chronic). Negative for hemoptysis, sputum production, shortness of breath, wheezing and stridor.   Cardiovascular: Positive for leg swelling. Negative for chest pain, palpitations, orthopnea, claudication and PND.  Gastrointestinal: Positive for constipation (prn MiraLax is adequate). Negative for heartburn, nausea, vomiting, abdominal pain, diarrhea, blood in stool and melena.  Genitourinary: Positive for frequency (after she takes am Torsemide). Negative for dysuria, urgency, hematuria and flank pain.  Musculoskeletal: Positive for joint pain. Negative for myalgias, back pain and falls.  Skin: Negative for itching and rash.       There is chronic skin lesion anterior right lower leg--been followed by her Dermatology  Neurological: Positive for tremors, sensory change and weakness (her usual state of ambulating with walker to and from her room to dinning room 3x/day). Negative for dizziness, tingling, speech change, focal weakness, seizures, loss of consciousness and headaches.  Endo/Heme/Allergies: Negative for environmental allergies and polydipsia. Does not bruise/bleed easily.  Psychiatric/Behavioral: Positive for depression (flat affect and  negatitive) and memory loss. Negative for suicidal ideas, hallucinations and substance abuse. The patient is not nervous/anxious and does not have insomnia.      Past Medical History  Diagnosis Date  . Diabetes mellitus without complication   . Leukemia,  chronic lymphoid   . Paralysis agitans   . Neuropathy   . Hypertension   . Lumbar stenosis   . Abnormal gait   . Parkinson disease    History reviewed. No pertinent past surgical history. Social History:   reports that she has never smoked. She has never used smokeless tobacco. She reports that she does not drink alcohol or use illicit drugs.  History reviewed. No pertinent family history.  Medications: Patient's Medications  New Prescriptions   No medications on file  Previous Medications   ACETAMINOPHEN (TYLENOL) 325 MG TABLET    Take 650 mg by mouth every morning.   ACETAMINOPHEN (TYLENOL) 325 MG TABLET    Take 650 mg by mouth every 6 (six) hours as needed. For pain   ATENOLOL (TENORMIN) 50 MG TABLET    Take 50 mg by mouth 2 (two) times daily.     CARBIDOPA-LEVODOPA (SINEMET IR) 25-100 MG PER TABLET    Take 1 tablet by mouth 3 (three) times daily.   CHOLECALCIFEROL (VITAMIN D) 2000 UNITS TABLET    Take 2,000 Units by mouth daily.   CLONIDINE (CATAPRES) 0.1 MG TABLET    Take 0.1 mg by mouth every 8 (eight) hours as needed. For blood pressure   DOXAZOSIN (CARDURA) 4 MG TABLET    Take 4 mg by mouth at bedtime.   GABAPENTIN (NEURONTIN) 600 MG TABLET    Take 600 mg by mouth 2 (two) times daily.   GLIMEPIRIDE (AMARYL) 2 MG TABLET    Take 2 mg by mouth daily before breakfast.   HYDROCODONE-ACETAMINOPHEN (NORCO/VICODIN) 5-325 MG PER TABLET    Take 2 tablets by mouth every 6 (six) hours as needed for pain.   LEVOTHYROXINE (SYNTHROID, LEVOTHROID) 100 MCG TABLET    Take 100 mcg by mouth every Monday, Wednesday, and Friday. Takes 1 tablet on mondays, wednesdays and fridays   LOSARTAN (COZAAR) 100 MG TABLET    Take 100 mg by mouth at bedtime.     POLYETHYL GLYCOL-PROPYL GLYCOL (SYSTANE) 0.4-0.3 % SOLN    Place 1 drop into both eyes 4 (four) times daily as needed. For dry eyes   POLYETHYLENE GLYCOL (MIRALAX / GLYCOLAX) PACKET    Take 17 g by mouth daily as needed. For constipation   POTASSIUM  CHLORIDE (KLOR-CON) 10 MEQ CR TABLET    Take 10 mEq by mouth every morning.     SITAGLIPTIN (JANUVIA) 100 MG TABLET    Take 1 tablet (100 mg total) by mouth daily.   TORSEMIDE (DEMADEX) 20 MG TABLET    Take 40 mg by mouth daily.   VITAMIN B-12 (CYANOCOBALAMIN) 1000 MCG TABLET    Take 1,000 mcg by mouth daily.  Modified Medications   No medications on file  Discontinued Medications   No medications on file     Physical Exam:Physical Exam  Constitutional: She is oriented to person, place, and time. She appears well-developed and well-nourished. No distress.  HENT:  Head: Normocephalic and atraumatic.  Right Ear: External ear normal.  Left Ear: External ear normal.  Nose: Nose normal.  Mouth/Throat: Oropharynx is clear and moist. No oropharyngeal exudate.  Eyes: Conjunctivae are normal. Pupils are equal, round, and reactive to light. Right eye exhibits no discharge. Left  eye exhibits no discharge. No scleral icterus.  Neck: Normal range of motion. Neck supple. No JVD present. No tracheal deviation present. No thyromegaly present.  Cardiovascular: Normal rate, regular rhythm and normal heart sounds.   No murmur heard. Pulmonary/Chest: Effort normal and breath sounds normal. No stridor. No respiratory distress. She has no wheezes. She has no rales. She exhibits no tenderness.  Abdominal: Soft. Bowel sounds are normal. She exhibits no distension. There is no tenderness. There is no rebound and no guarding.  Musculoskeletal: Normal range of motion. She exhibits edema. She exhibits no tenderness.  Lymphadenopathy:    She has no cervical adenopathy.  Neurological: She is alert and oriented to person, place, and time. She has normal reflexes. She displays normal reflexes. No cranial nerve deficit. She exhibits normal muscle tone. Coordination normal.  Tremor seen in her hands and chin.   Skin: Skin is warm and dry. No rash noted. She is not diaphoretic. No erythema. No pallor.  Anterior right  shin skin lesion--Dermatology  Psychiatric: Her mood appears not anxious. Her affect is not angry, not blunt, not labile and not inappropriate. Her speech is not rapid and/or pressured, not delayed, not tangential and not slurred. She is not agitated, not aggressive, not hyperactive, not slowed, not withdrawn, not actively hallucinating and not combative. Thought content is not paranoid and not delusional. Cognition and memory are impaired. She does not express impulsivity. She exhibits a depressed mood (flat affect). She is communicative. She exhibits abnormal recent memory. She exhibits normal remote memory. She is attentive.       Labs reviewed: Basic Metabolic Panel:  Recent Labs  16/10/96 0618 08/26/12  NA 141 144  K 4.3 4.1  CL 100  --   CO2 30  --   GLUCOSE 117*  --   BUN 38* 30*  CREATININE 1.05 0.9  CALCIUM 9.4  --   TSH  --  1.50   Liver Function Tests:  Recent Labs  08/26/12  AST 14  ALT 8  ALKPHOS 99   No results found for this basename: LIPASE, AMYLASE,  in the last 8760 hours No results found for this basename: AMMONIA,  in the last 8760 hours CBC:  Recent Labs  02/25/12 0618 08/26/12  WBC 7.0 6.5  HGB 12.3 13.5  HCT 36.9 39  MCV 96.6  --   PLT 105* 142*   Lipid Panel:  Recent Labs  08/26/12  CHOL 187  HDL 31*  LDLCALC 121  TRIG 177*   Anemia Panel: No results found for this basename: FOLATE, IRON, VITAMINB12,  in the last 8760 hours  Past Procedures:     Assessment/Plan Weight gain Weights in 2014: Jan # 211, Feb #214, Mar #210, Apr #213, May #211, Jun #214, July #218--denied SOB, orthopnea, bathroom trip average x1/night, chronic BLE edema 1+ wearing compression hosiery. The patient and her POA-son are concerned of the patient's waistline--LFT wnl 08/26/12 and denied abd pain/discomofrt or N/V/D--will obtain US abd and pelvis to evaluate further to r/o ascites. The patient and her POA-son desire comfort feeding instead of ADA diet.   Type  II or unspecified type diabetes mellitus without mention of complication, not stated as uncontrolled Hgb A1c 6.2 08/27/12, continue Glimepiride 2mg  daily  Parkinson disease Chin and hand tremor observed but not disabling, takes Sinemet 25/250mg  tid. Walks to and from dinning room for her room with walker x3/day  Unspecified hypothyroidism Takes Levothyroxine , TSH 1.499 08/27/12  HTN (hypertension) Controlled on Doxazosin 4mg   qd, Torsemide 50mg  qd, Atenolol 50mg  bid, and Losartan 100mg  daily  Edema Chronic BLE, 1+, compression hosiery, takes Torsemide 50mg  daily, Bun/creat 30/0.93 08/27/12  Osteoarthritis Stiffness and aches in multiple sites--takes Tylenol 650mg  qam to get her day started.   Peripheral neuropathy Burning sensation in her feet--able to sleep through night 9pm-7am with x1 bathroom trip in average. Takes Gabapentin 600mg  bid.     Family/ Staff Communication: observe the patient.   Goals of Care: AL  Labs/tests ordered: none  Total time >59minutes >50% formulating/coordinating plan of care

## 2012-08-31 NOTE — Assessment & Plan Note (Signed)
Controlled on Doxazosin 4mg  qd, Torsemide 50mg  qd, Atenolol 50mg  bid, and Losartan 100mg  daily

## 2012-08-31 NOTE — Assessment & Plan Note (Signed)
Hgb A1c 6.2 08/27/12, continue Glimepiride 2mg  daily

## 2012-08-31 NOTE — Assessment & Plan Note (Signed)
Chin and hand tremor observed but not disabling, takes Sinemet 25/250mg tid. Walks to and from dinning room for her room with walker x3/day   

## 2012-11-25 ENCOUNTER — Non-Acute Institutional Stay: Payer: Medicare Other | Admitting: Nurse Practitioner

## 2012-11-25 ENCOUNTER — Encounter: Payer: Self-pay | Admitting: Nurse Practitioner

## 2012-11-25 DIAGNOSIS — E039 Hypothyroidism, unspecified: Secondary | ICD-10-CM

## 2012-11-25 DIAGNOSIS — G609 Hereditary and idiopathic neuropathy, unspecified: Secondary | ICD-10-CM

## 2012-11-25 DIAGNOSIS — E119 Type 2 diabetes mellitus without complications: Secondary | ICD-10-CM

## 2012-11-25 DIAGNOSIS — R609 Edema, unspecified: Secondary | ICD-10-CM

## 2012-11-25 DIAGNOSIS — J209 Acute bronchitis, unspecified: Secondary | ICD-10-CM

## 2012-11-25 DIAGNOSIS — I10 Essential (primary) hypertension: Secondary | ICD-10-CM

## 2012-11-25 DIAGNOSIS — G629 Polyneuropathy, unspecified: Secondary | ICD-10-CM

## 2012-11-25 DIAGNOSIS — G20 Parkinson's disease: Secondary | ICD-10-CM

## 2012-11-25 DIAGNOSIS — M1990 Unspecified osteoarthritis, unspecified site: Secondary | ICD-10-CM

## 2012-11-25 NOTE — Assessment & Plan Note (Signed)
Stiffness and aches in multiple sites--takes Tylenol 650mg  qam to get her day started.

## 2012-11-25 NOTE — Assessment & Plan Note (Addendum)
BLE--takes Torsemide 50mg  daily, CXR 11/23/12 showed boderline cardiomegaly unchanged without pulmonary vascular congestion or pleural effusion. Continue to monitor the patient. Update CMP

## 2012-11-25 NOTE — Assessment & Plan Note (Signed)
Takes Levothyroxine 100mcg M, W, F, last TSH 1.499 08/27/12     

## 2012-11-25 NOTE — Assessment & Plan Note (Signed)
Burning sensation in her feet--able to sleep through night 9pm-7am with x1 bathroom trip in average. Takes Gabapentin 600mg bid.    

## 2012-11-25 NOTE — Assessment & Plan Note (Signed)
Controlled on Doxazosin 4mg qd, Torsemide 50mg qd, Atenolol 50mg bid, and Losartan 100mg daily. Clonidine 0.1mg prn q8hr available to her.    

## 2012-11-25 NOTE — Progress Notes (Signed)
Patient ID: Vanessa Carroll, female   DOB: 07-15-1920, 79 y.o.   MRN: [id number]  Code Status: DNR  Allergies  Allergen Reactions  . Codeine Nausea And Vomiting  . Sulfa Antibiotics Swelling    Chief Complaint  Patient presents with  . Medical Managment of Chronic Issues    acute bronchitis    HPI: Patient is a 79 y.o. female seen in the [facility] at [facility] today for evaluation of acute bronchitis and her chronic medical conditions  Problem List Items Addressed This Visit   Acute bronchitis - Primary     Congestive cough, wheezes, yellow sputum, hx of Asthma, CXR mild bronchitis at the mid and lower lungs bilaterally. Augmentin 825mg  q12 hr since 09/23/12--will treat for total 10 days. Adding Mucinex 600mg  bid for 10 days, DuoNeb q8hr for 3 days-then prn for one week. Check CBC    Edema     BLE--takes Torsemide 50mg  daily, CXR 11/23/12 showed boderline cardiomegaly unchanged without pulmonary vascular congestion or pleural effusion. Continue to monitor the patient. Update CMP    HTN (hypertension)     Controlled on Doxazosin 4mg  qd, Torsemide 50mg  qd, Atenolol 50mg  bid, and Losartan 100mg  daily. Clonidine 0.1mg  prn q8hr available to her.       Osteoarthritis     Stiffness and aches in multiple sites--takes Tylenol 650mg  qam to get her day started.       Parkinson disease (Chronic)     Chin and hand tremor observed but not disabling, takes Sinemet 25/250mg  tid. Walks to and from dinning room for her room with walker x3/day      Peripheral neuropathy     Burning sensation in her feet--able to sleep through night 9pm-7am with x1 bathroom trip in average. Takes Gabapentin 600mg  bid.       Type II or unspecified type diabetes mellitus without mention of complication, not stated as uncontrolled (Chronic)     Will have CBG check daily for one week since the acute bronchitis, then reduce to CBG check 2x/week.     Unspecified hypothyroidism     Takes Levothyroxine M,  W, F, last TSH 1.499 08/27/12       Review of Systems:  Review of Systems  Constitutional: Positive for weight loss (weight gain). Negative for fever, chills, malaise/fatigue and diaphoresis.  HENT: Positive for hearing loss (mild). Negative for congestion, ear discharge, ear pain, nosebleeds, sore throat and tinnitus.   Eyes: Negative for blurred vision, double vision, photophobia, pain, discharge and redness.  Respiratory: Positive for cough (chronic), sputum production and wheezing. Negative for hemoptysis, shortness of breath and stridor.   Cardiovascular: Positive for leg swelling. Negative for chest pain, palpitations, orthopnea, claudication and PND.  Gastrointestinal: Positive for constipation (prn MiraLax is adequate). Negative for heartburn, nausea, vomiting, abdominal pain, diarrhea, blood in stool and melena.  Genitourinary: Positive for frequency (after she takes am Torsemide). Negative for dysuria, urgency, hematuria and flank pain.  Musculoskeletal: Positive for joint pain. Negative for back pain, falls, myalgias and neck pain.  Skin: Negative for itching and rash.       There is chronic skin lesion anterior right lower leg--been followed by her Dermatology  Neurological: Positive for tremors, sensory change and weakness (her usual state of ambulating with walker to and from her room to dinning room 3x/day). Negative for dizziness, tingling, speech change, focal weakness, seizures, loss of consciousness and headaches.  Endo/Heme/Allergies: Negative for environmental allergies and polydipsia. Does not bruise/bleed easily.  Psychiatric/Behavioral:  Positive for depression (flat affect and negatitive) and memory loss. Negative for suicidal ideas, hallucinations and substance abuse. The patient is not nervous/anxious and does not have insomnia.      Past Medical History  Diagnosis Date  . Diabetes mellitus without complication   . Leukemia, chronic lymphoid   . Paralysis agitans    . Neuropathy   . Hypertension   . Lumbar stenosis   . Abnormal gait   . Parkinson disease    Past Surgical History  Procedure Laterality Date  . Cholecystectomy  07/2003   Social History:   reports that she has never smoked. She has never used smokeless tobacco. She reports that she does not drink alcohol or use illicit drugs.  Family History  Problem Relation Age of Onset  . Kidney disease Mother   . Heart disease Father     MI    Medications: Patient's Medications  New Prescriptions   No medications on file  Previous Medications   ACETAMINOPHEN (TYLENOL) 325 MG TABLET    Take 650 mg by mouth every morning.   ACETAMINOPHEN (TYLENOL) 325 MG TABLET    Take 650 mg by mouth. Take 2 tablets every 8 hours as needed for pain   ATENOLOL (TENORMIN) 50 MG TABLET    Take 50 mg by mouth 2 (two) times daily.     CARBIDOPA-LEVODOPA (SINEMET IR) 25-250 MG PER TABLET    Take 1 tablet by mouth 3 (three) times daily.   CHOLECALCIFEROL (VITAMIN D) 2000 UNITS TABLET    Take 2,000 Units by mouth daily.   CLONIDINE (CATAPRES) 0.1 MG TABLET    Take 0.1 mg by mouth every 8 (eight) hours as needed. For blood pressure   DOXAZOSIN (CARDURA) 4 MG TABLET    Take 4 mg by mouth at bedtime.   GABAPENTIN (NEURONTIN) 600 MG TABLET    Take 600 mg by mouth 2 (two) times daily.   GLIMEPIRIDE (AMARYL) 2 MG TABLET    Take 2 mg by mouth daily before breakfast.   LEVOTHYROXINE (SYNTHROID, LEVOTHROID) 100 MCG TABLET    Take 100 mcg by mouth every Monday, Wednesday, and Friday. Takes 1 tablet on mondays, wednesdays and fridays   LOSARTAN (COZAAR) 100 MG TABLET    Take 100 mg by mouth at bedtime.     POLYETHYL GLYCOL-PROPYL GLYCOL (SYSTANE) 0.4-0.3 % SOLN    Place 1 drop into both eyes. One drop in both eyes twice daily, for dry eyes   POLYETHYLENE GLYCOL (MIRALAX / GLYCOLAX) PACKET    Take 17 g by mouth daily as needed. For constipation   TORSEMIDE (DEMADEX) 100 MG TABLET    Take 100 mg by mouth daily. Take 1/2 tablet  daily   VITAMIN B-12 (CYANOCOBALAMIN) 1000 MCG TABLET    Take 1,000 mcg by mouth daily.  Modified Medications   No medications on file  Discontinued Medications   No medications on file     Physical Exam:Physical Exam  Constitutional: She is oriented to person, place, and time. She appears well-developed and well-nourished. No distress.  HENT:  Head: Normocephalic and atraumatic.  Right Ear: External ear normal.  Left Ear: External ear normal.  Nose: Nose normal.  Mouth/Throat: Oropharynx is clear and moist. No oropharyngeal exudate.  Eyes: Conjunctivae are normal. Pupils are equal, round, and reactive to light. Right eye exhibits no discharge. Left eye exhibits no discharge. No scleral icterus.  Neck: Normal range of motion. Neck supple. No JVD present. No tracheal deviation present. No thyromegaly  present.  Cardiovascular: Normal rate, regular rhythm and normal heart sounds.   No murmur heard. Pulmonary/Chest: Effort normal. No stridor. No respiratory distress. She has wheezes. She has rales. She exhibits no tenderness.  Diffused wheezes noted mid lungs. Rales appreciated mid/lower lungs.   Abdominal: Soft. Bowel sounds are normal. She exhibits no distension. There is no tenderness. There is no rebound and no guarding.  Musculoskeletal: Normal range of motion. She exhibits edema. She exhibits no tenderness.  Lymphadenopathy:    She has no cervical adenopathy.  Neurological: She is alert and oriented to person, place, and time. She has normal reflexes. No cranial nerve deficit. She exhibits normal muscle tone. Coordination normal.  Tremor seen in her hands and chin.   Skin: Skin is warm and dry. No rash noted. She is not diaphoretic. No erythema. No pallor.  Anterior right shin skin lesion--Dermatology  Psychiatric: Her mood appears not anxious. Her affect is not angry, not blunt, not labile and not inappropriate. Her speech is not rapid and/or pressured, not delayed, not tangential  and not slurred. She is not agitated, not aggressive, not hyperactive, not slowed, not withdrawn, not actively hallucinating and not combative. Thought content is not paranoid and not delusional. Cognition and memory are impaired. She does not express impulsivity. She exhibits a depressed mood (flat affect). She is communicative. She exhibits abnormal recent memory. She exhibits normal remote memory. She is attentive.       Labs reviewed: Basic Metabolic Panel:  Recent Labs  29/56/21 0618 08/26/12  NA 141 144  K 4.3 4.1  CL 100  --   CO2 30  --   GLUCOSE 117*  --   BUN 38* 30*  CREATININE 1.05 0.9  CALCIUM 9.4  --   TSH  --  1.50   Liver Function Tests:  Recent Labs  08/26/12  AST 14  ALT 8  ALKPHOS 99   CBC:  Recent Labs  02/25/12 0618 08/26/12  WBC 7.0 6.5  HGB 12.3 13.5  HCT 36.9 39  MCV 96.6  --   PLT 105* 142*   Lipid Panel:  Recent Labs  08/26/12  CHOL 187  HDL 31*  LDLCALC 121  TRIG 177*   Past Procedures:  09/01/12 US Pelvis: s/p hysterectomy. Nether ovary visualized. No free intraperitoneal fluid or adnexal mass.   09/01/12 [place] abd: mild atrophy left kidney. Mild hepatomegaly. S/p cholecystectomy. Distal abdominal aorta not seen secondary to obscuring bowel gas.   10/22/12 X-ray R+L knees: no acute fracture, malalignment, or lytic destructive lesion. Small degenerative spur at the superior patella. No significant joint space narrowing.   11/23/12 CXR mild bronchitis at the mid and lower lungs bilaterally. Borderline cardiomegaly unchanged without pulmonary vascular congestion or pleural effusion. No inflammatory consolidate or suspicious nodule.    Assessment/Plan Acute bronchitis Congestive cough, wheezes, yellow sputum, hx of Asthma, CXR mild bronchitis at the mid and lower lungs bilaterally. Augmentin 825mg  q12 hr since 09/23/12--will treat for total 10 days. Adding Mucinex 600mg  bid for 10 days, DuoNeb q8hr for 3 days-then prn for one week. Check  CBC  Edema BLE--takes Torsemide 50mg  daily, CXR 11/23/12 showed boderline cardiomegaly unchanged without pulmonary vascular congestion or pleural effusion. Continue to monitor the patient. Update CMP  HTN (hypertension) Controlled on Doxazosin 4mg  qd, Torsemide 50mg  qd, Atenolol 50mg  bid, and Losartan 100mg  daily. Clonidine 0.1mg  prn q8hr available to her.     Osteoarthritis Stiffness and aches in multiple sites--takes Tylenol 650mg  qam to get her day  started.     Parkinson disease Chin and hand tremor observed but not disabling, takes Sinemet 25/250mg  tid. Walks to and from dinning room for her room with walker x3/day    Peripheral neuropathy Burning sensation in her feet--able to sleep through night 9pm-7am with x1 bathroom trip in average. Takes Gabapentin 600mg  bid.     Type II or unspecified type diabetes mellitus without mention of complication, not stated as uncontrolled Will have CBG check daily for one week since the acute bronchitis, then reduce to CBG check 2x/week.   Unspecified hypothyroidism Takes Levothyroxine M, W, F, last TSH 1.499 08/27/12    Family/ Staff Communication: observe the patient.   Goals of Care: AL  Labs/tests ordered: CBC and CMP

## 2012-11-25 NOTE — Assessment & Plan Note (Addendum)
Will have CBG check daily for one week since the acute bronchitis, then reduce to CBG check 2x/week.

## 2012-11-25 NOTE — Assessment & Plan Note (Signed)
Congestive cough, wheezes, yellow sputum, hx of Asthma, CXR mild bronchitis at the mid and lower lungs bilaterally. Augmentin 825mg  q12 hr since 09/23/12--will treat for total 10 days. Adding Mucinex 600mg  bid for 10 days, DuoNeb q8hr for 3 days-then prn for one week. Check CBC

## 2012-11-25 NOTE — Assessment & Plan Note (Signed)
Chin and hand tremor observed but not disabling, takes Sinemet 25/250mg tid. Walks to and from dinning room for her room with walker x3/day   

## 2012-11-26 LAB — CBC AND DIFFERENTIAL: WBC: 6.9 10^3/mL

## 2012-11-26 LAB — BASIC METABOLIC PANEL
BUN: 26 mg/dL — AB (ref 4–21)
Creatinine: 0.9 mg/dL (ref 0.5–1.1)
Glucose: 105 mg/dL
Potassium: 4.1 mmol/L (ref 3.4–5.3)

## 2012-11-26 LAB — HEPATIC FUNCTION PANEL: AST: 12 U/L — AB (ref 13–35)

## 2012-12-10 ENCOUNTER — Non-Acute Institutional Stay: Payer: Medicare Other | Admitting: Nurse Practitioner

## 2012-12-10 ENCOUNTER — Encounter: Payer: Self-pay | Admitting: Nurse Practitioner

## 2012-12-10 VITALS — BP 158/72 | HR 80 | Ht 67.0 in | Wt 211.0 lb

## 2012-12-10 DIAGNOSIS — I10 Essential (primary) hypertension: Secondary | ICD-10-CM

## 2012-12-10 DIAGNOSIS — G609 Hereditary and idiopathic neuropathy, unspecified: Secondary | ICD-10-CM

## 2012-12-10 DIAGNOSIS — J209 Acute bronchitis, unspecified: Secondary | ICD-10-CM

## 2012-12-10 DIAGNOSIS — G629 Polyneuropathy, unspecified: Secondary | ICD-10-CM

## 2012-12-10 DIAGNOSIS — M1990 Unspecified osteoarthritis, unspecified site: Secondary | ICD-10-CM

## 2012-12-10 DIAGNOSIS — E119 Type 2 diabetes mellitus without complications: Secondary | ICD-10-CM

## 2012-12-10 DIAGNOSIS — E538 Deficiency of other specified B group vitamins: Secondary | ICD-10-CM

## 2012-12-10 DIAGNOSIS — E162 Hypoglycemia, unspecified: Secondary | ICD-10-CM

## 2012-12-10 DIAGNOSIS — R609 Edema, unspecified: Secondary | ICD-10-CM

## 2012-12-10 DIAGNOSIS — G20A1 Parkinson's disease without dyskinesia, without mention of fluctuations: Secondary | ICD-10-CM

## 2012-12-10 DIAGNOSIS — E039 Hypothyroidism, unspecified: Secondary | ICD-10-CM

## 2012-12-10 DIAGNOSIS — G20 Parkinson's disease: Secondary | ICD-10-CM

## 2012-12-10 NOTE — Assessment & Plan Note (Signed)
Takes Levothyroxine 100mcg M, W, F, last TSH 1.499 08/27/12     

## 2012-12-10 NOTE — Assessment & Plan Note (Signed)
Chin and hand tremor observed but not disabling, takes Sinemet 25/250mg tid. Walks to and from dinning room for her room with walker x3/day   

## 2012-12-10 NOTE — Assessment & Plan Note (Addendum)
Congestive cough, wheezes, yellow sputum, hx of Asthma, CXR 11/23/12 mild bronchitis at the mid and lower lungs bilaterally. Completed 10 day course of Augmentin and Mucinex. Improved but not healed-after infection cough persisted. DuoNeb extended and Medrol dose pack started. Much improved except cough--will continue Mucinex 600mg  bid for 10 days and DuoNeb q6hr for 2 weeks. Occasional wheezes noted.

## 2012-12-10 NOTE — Assessment & Plan Note (Addendum)
Will continue to check fasting CBG in am for 10 days. Range was 122-168 in the past week.

## 2012-12-10 NOTE — Assessment & Plan Note (Signed)
Burning sensation in her feet--able to sleep through night 9pm-7am with x1 bathroom trip in average. Takes Gabapentin 600mg bid.    

## 2012-12-10 NOTE — Assessment & Plan Note (Signed)
Hgb A1c 6.2 08/27/12, continue Glimepiride 2mg  daily, last fasting blood sugar 105 11/26/12

## 2012-12-10 NOTE — Assessment & Plan Note (Addendum)
Stiffness and aches in multiple sites--takes Tylenol 650mg  qam to get her day started. The patient desires it to be as needed.

## 2012-12-10 NOTE — Assessment & Plan Note (Signed)
Controlled on Doxazosin 4mg qd, Torsemide 50mg qd, Atenolol 50mg bid, and Losartan 100mg daily. Clonidine 0.1mg prn q8hr available to her.    

## 2012-12-10 NOTE — Assessment & Plan Note (Signed)
Hgb wnl last checked. Will dc Vitamin B12 po daily, f/u Vit B12 level in one month.

## 2012-12-10 NOTE — Assessment & Plan Note (Addendum)
BLE--takes Torsemide 50mg  daily, CXR 11/23/12 showed boderline cardiomegaly unchanged without pulmonary vascular congestion or pleural effusion. Continue to monitor the patient. Bun/creat 26/0.88 11/26/12. No change in her daily weights in the past 2 days.

## 2012-12-10 NOTE — Progress Notes (Signed)
Patient ID: Vanessa Carroll, female   DOB: 1920/10/01, 79 y.o.   MRN: [id number]  Code Status: DNR  Allergies  Allergen Reactions  . Codeine Nausea And Vomiting  . Sulfa Antibiotics Swelling    Chief Complaint  Patient presents with  . Bronchitis    follow up with son     HPI: Patient is a 79 y.o. female seen in the clinic at [facility] today for evaluation of acute bronchitis and her chronic medical conditions  Problem List Items Addressed This Visit   Acute bronchitis - Primary     Congestive cough, wheezes, yellow sputum, hx of Asthma, CXR 11/23/12 mild bronchitis at the mid and lower lungs bilaterally. Completed 10 day course of Augmentin and Mucinex. Improved but not healed-after infection cough persisted. DuoNeb extended and Medrol dose pack started. Much improved except cough--will continue Mucinex 600mg  bid for 10 days and DuoNeb q6hr for 2 weeks. Occasional wheezes noted.       Edema     BLE--takes Torsemide 50mg  daily, CXR 11/23/12 showed boderline cardiomegaly unchanged without pulmonary vascular congestion or pleural effusion. Continue to monitor the patient. Bun/creat 26/0.88 11/26/12. No change in her daily weights in the past 2 days.       HTN (hypertension)     Controlled on Doxazosin 4mg  qd, Torsemide 50mg  qd, Atenolol 50mg  bid, and Losartan 100mg  daily. Clonidine 0.1mg  prn q8hr available to her.         Hypoglycemia     Hgb A1c 6.2 08/27/12, continue Glimepiride 2mg  daily, last fasting blood sugar 105 11/26/12      Osteoarthritis     Stiffness and aches in multiple sites--takes Tylenol 650mg  qam to get her day started. The patient desires it to be as needed.         Parkinson disease (Chronic)     Chin and hand tremor observed but not disabling, takes Sinemet 25/250mg  tid. Walks to and from dinning room for her room with walker x3/day        Peripheral neuropathy     Burning sensation in her feet--able to sleep through night 9pm-7am with x1  bathroom trip in average. Takes Gabapentin 600mg  bid.         Type II or unspecified type diabetes mellitus without mention of complication, not stated as uncontrolled (Chronic)     Will continue to check fasting CBG in am for 10 days. Range was 122-168 in the past week.     Unspecified hypothyroidism     Takes Levothyroxine M, W, F, last TSH 1.499 08/27/12      Vitamin B12 deficiency     Hgb wnl last checked. Will dc Vitamin B12 po daily, f/u Vit B12 level in one month.        Review of Systems:  Review of Systems  Constitutional: Negative for fever, chills, weight loss (weight gain), malaise/fatigue and diaphoresis.  HENT: Positive for hearing loss (mild). Negative for congestion, ear discharge, ear pain, nosebleeds, sore throat and tinnitus.   Eyes: Negative for blurred vision, double vision, photophobia, pain, discharge and redness.  Respiratory: Positive for cough (chronic), sputum production and wheezing. Negative for hemoptysis, shortness of breath and stridor.   Cardiovascular: Positive for leg swelling. Negative for chest pain, palpitations, orthopnea, claudication and PND.  Gastrointestinal: Positive for constipation (prn MiraLax is adequate). Negative for heartburn, nausea, vomiting, abdominal pain, diarrhea, blood in stool and melena.  Genitourinary: Positive for frequency (after she takes am Torsemide). Negative for  dysuria, urgency, hematuria and flank pain.  Musculoskeletal: Positive for joint pain. Negative for back pain, falls, myalgias and neck pain.  Skin: Negative for itching and rash.       There is chronic skin lesion anterior right lower leg--been followed by her Dermatology  Neurological: Positive for tremors and sensory change. Negative for dizziness, tingling, speech change, focal weakness, seizures, loss of consciousness, weakness (her usual state of ambulating with walker to and from her room to dinning room 3x/day) and headaches.   Endo/Heme/Allergies: Negative for environmental allergies and polydipsia. Does not bruise/bleed easily.  Psychiatric/Behavioral: Positive for depression (flat affect and negatitive) and memory loss. Negative for suicidal ideas, hallucinations and substance abuse. The patient is not nervous/anxious and does not have insomnia.      Past Medical History  Diagnosis Date  . Diabetes mellitus without complication   . Leukemia, chronic lymphoid   . Paralysis agitans   . Neuropathy   . Hypertension   . Lumbar stenosis   . Abnormal gait   . Parkinson disease    Past Surgical History  Procedure Laterality Date  . Cholecystectomy  07/2003   Social History:   reports that she has never smoked. She has never used smokeless tobacco. She reports that she does not drink alcohol or use illicit drugs.  Family History  Problem Relation Age of Onset  . Kidney disease Mother   . Heart disease Father     MI    Medications: Patient's Medications  New Prescriptions   No medications on file  Previous Medications   ACETAMINOPHEN (TYLENOL) 325 MG TABLET    Take 650 mg by mouth. Take 2 tablets every 8 hours as needed for pain   ATENOLOL (TENORMIN) 50 MG TABLET    Take 50 mg by mouth 2 (two) times daily.     CARBIDOPA-LEVODOPA (SINEMET IR) 25-250 MG PER TABLET    Take 1 tablet by mouth 3 (three) times daily.   CHOLECALCIFEROL (VITAMIN D) 2000 UNITS TABLET    Take 2,000 Units by mouth daily.   CLONIDINE (CATAPRES) 0.1 MG TABLET    Take 0.1 mg by mouth every 8 (eight) hours as needed. For blood pressure   DOXAZOSIN (CARDURA) 4 MG TABLET    Take 4 mg by mouth at bedtime.   GABAPENTIN (NEURONTIN) 600 MG TABLET    Take 600 mg by mouth 2 (two) times daily.   GLIMEPIRIDE (AMARYL) 2 MG TABLET    Take 2 mg by mouth daily before breakfast.   LEVOTHYROXINE (SYNTHROID, LEVOTHROID) 100 MCG TABLET    Take 100 mcg by mouth every Monday, Wednesday, and Friday. Takes 1 tablet on mondays, wednesdays and fridays    LOSARTAN (COZAAR) 100 MG TABLET    Take 100 mg by mouth at bedtime.     POLYETHYL GLYCOL-PROPYL GLYCOL (SYSTANE) 0.4-0.3 % SOLN    Place 1 drop into both eyes as needed. One drop in both eyes twice daily, for dry eyes   POLYETHYLENE GLYCOL (MIRALAX / GLYCOLAX) PACKET    Take 17 g by mouth daily as needed. For constipation   TORSEMIDE (DEMADEX) 100 MG TABLET    Take 100 mg by mouth daily. Take 1/2 tablet daily  Modified Medications   No medications on file  Discontinued Medications   ACETAMINOPHEN (TYLENOL) 325 MG TABLET    Take 650 mg by mouth every morning.   VITAMIN B-12 (CYANOCOBALAMIN) 1000 MCG TABLET    Take 1,000 mcg by mouth daily.  Physical Exam:Physical Exam  Constitutional: She is oriented to person, place, and time. She appears well-developed and well-nourished. No distress.  HENT:  Head: Normocephalic and atraumatic.  Right Ear: External ear normal.  Left Ear: External ear normal.  Nose: Nose normal.  Mouth/Throat: Oropharynx is clear and moist. No oropharyngeal exudate.  Eyes: Conjunctivae are normal. Pupils are equal, round, and reactive to light. Right eye exhibits no discharge. Left eye exhibits no discharge. No scleral icterus.  Neck: Normal range of motion. Neck supple. No JVD present. No tracheal deviation present. No thyromegaly present.  Cardiovascular: Normal rate, regular rhythm and normal heart sounds.   No murmur heard. Pulmonary/Chest: Effort normal. No stridor. No respiratory distress. She has wheezes. She has rales. She exhibits no tenderness.  Diffused wheezes noted mid lungs-improved-only occasional scattered wheezes noted. Rales appreciated mid/lower lungs-resolved.   Abdominal: Soft. Bowel sounds are normal. She exhibits no distension. There is no tenderness. There is no rebound and no guarding.  Musculoskeletal: Normal range of motion. She exhibits edema. She exhibits no tenderness.  Lymphadenopathy:    She has no cervical adenopathy.  Neurological:  She is alert and oriented to person, place, and time. She has normal reflexes. No cranial nerve deficit. She exhibits normal muscle tone. Coordination normal.  Tremor seen in her hands and chin.   Skin: Skin is warm and dry. No rash noted. She is not diaphoretic. No erythema. No pallor.  Anterior right shin skin lesion--Dermatology  Psychiatric: Her mood appears not anxious. Her affect is not angry, not blunt, not labile and not inappropriate. Her speech is not rapid and/or pressured, not delayed, not tangential and not slurred. She is not agitated, not aggressive, not hyperactive, not slowed, not withdrawn, not actively hallucinating and not combative. Thought content is not paranoid and not delusional. Cognition and memory are impaired. She does not express impulsivity. She exhibits a depressed mood (flat affect). She is communicative. She exhibits abnormal recent memory. She exhibits normal remote memory. She is attentive.       Labs reviewed: Basic Metabolic Panel:  Recent Labs  16/10/96 0618 08/26/12 11/26/12  NA 141 144 143  K 4.3 4.1 4.1  CL 100  --   --   CO2 30  --   --   GLUCOSE 117*  --   --   BUN 38* 30* 26*  CREATININE 1.05 0.9 0.9  CALCIUM 9.4  --   --   TSH  --  1.50  --    Liver Function Tests:  Recent Labs  08/26/12 11/26/12  AST 14 12*  ALT 8 8  ALKPHOS 99 85   CBC:  Recent Labs  02/25/12 0618 08/26/12 11/26/12  WBC 7.0 6.5 6.9  HGB 12.3 13.5 12.5  HCT 36.9 39 36  MCV 96.6  --   --   PLT 105* 142* 130*   Lipid Panel:  Recent Labs  08/26/12  CHOL 187  HDL 31*  LDLCALC 121  TRIG 177*   Past Procedures:  09/01/12 US Pelvis: s/p hysterectomy. Nether ovary visualized. No free intraperitoneal fluid or adnexal mass.   09/01/12 [place] abd: mild atrophy left kidney. Mild hepatomegaly. S/p cholecystectomy. Distal abdominal aorta not seen secondary to obscuring bowel gas.   10/22/12 X-ray R+L knees: no acute fracture, malalignment, or lytic destructive  lesion. Small degenerative spur at the superior patella. No significant joint space narrowing.   11/23/12 CXR mild bronchitis at the mid and lower lungs bilaterally. Borderline cardiomegaly unchanged without pulmonary vascular  congestion or pleural effusion. No inflammatory consolidate or suspicious nodule.    Assessment/Plan Acute bronchitis Congestive cough, wheezes, yellow sputum, hx of Asthma, CXR 11/23/12 mild bronchitis at the mid and lower lungs bilaterally. Completed 10 day course of Augmentin and Mucinex. Improved but not healed-after infection cough persisted. DuoNeb extended and Medrol dose pack started. Much improved except cough--will continue Mucinex 600mg  bid for 10 days and DuoNeb q6hr for 2 weeks. Occasional wheezes noted.     HTN (hypertension) Controlled on Doxazosin 4mg  qd, Torsemide 50mg  qd, Atenolol 50mg  bid, and Losartan 100mg  daily. Clonidine 0.1mg  prn q8hr available to her.       Hypoglycemia Hgb A1c 6.2 08/27/12, continue Glimepiride 2mg  daily, last fasting blood sugar 105 11/26/12    Peripheral neuropathy Burning sensation in her feet--able to sleep through night 9pm-7am with x1 bathroom trip in average. Takes Gabapentin 600mg  bid.       Unspecified hypothyroidism Takes Levothyroxine M, W, F, last TSH 1.499 08/27/12    Parkinson disease Chin and hand tremor observed but not disabling, takes Sinemet 25/250mg  tid. Walks to and from dinning room for her room with walker x3/day      Osteoarthritis Stiffness and aches in multiple sites--takes Tylenol 650mg  qam to get her day started. The patient desires it to be as needed.       Edema BLE--takes Torsemide 50mg  daily, CXR 11/23/12 showed boderline cardiomegaly unchanged without pulmonary vascular congestion or pleural effusion. Continue to monitor the patient. Bun/creat 26/0.88 11/26/12. No change in her daily weights in the past 2 days.     Type II or unspecified type diabetes mellitus  without mention of complication, not stated as uncontrolled Will continue to check fasting CBG in am for 10 days. Range was 122-168 in the past week.   Vitamin B12 deficiency Hgb wnl last checked. Will dc Vitamin B12 po daily, f/u Vit B12 level in one month.     Family/ Staff Communication: observe the patient.   Goals of Care: AL  Labs/tests ordered: none

## 2013-03-02 ENCOUNTER — Other Ambulatory Visit: Payer: Self-pay | Admitting: Dermatology

## 2013-04-01 ENCOUNTER — Encounter: Payer: Self-pay | Admitting: Nurse Practitioner

## 2013-04-01 ENCOUNTER — Non-Acute Institutional Stay: Payer: Medicare Other | Admitting: Nurse Practitioner

## 2013-04-01 VITALS — BP 132/62 | HR 76 | Wt 213.0 lb

## 2013-04-01 DIAGNOSIS — E039 Hypothyroidism, unspecified: Secondary | ICD-10-CM

## 2013-04-01 DIAGNOSIS — R609 Edema, unspecified: Secondary | ICD-10-CM

## 2013-04-01 DIAGNOSIS — E119 Type 2 diabetes mellitus without complications: Secondary | ICD-10-CM

## 2013-04-01 DIAGNOSIS — G629 Polyneuropathy, unspecified: Secondary | ICD-10-CM

## 2013-04-01 DIAGNOSIS — G20 Parkinson's disease: Secondary | ICD-10-CM

## 2013-04-01 DIAGNOSIS — I10 Essential (primary) hypertension: Secondary | ICD-10-CM

## 2013-04-01 DIAGNOSIS — G609 Hereditary and idiopathic neuropathy, unspecified: Secondary | ICD-10-CM

## 2013-04-01 DIAGNOSIS — J45901 Unspecified asthma with (acute) exacerbation: Secondary | ICD-10-CM

## 2013-04-04 NOTE — Progress Notes (Signed)
Patient ID: ENIJAH FURR, female   DOB: 05-14-1920, 80 y.o.   MRN: [id number]   Code Status: DNR  Allergies  Allergen Reactions  . Codeine Nausea And Vomiting  . Sulfa Antibiotics Swelling    Chief Complaint  Patient presents with  . Medical Managment of Chronic Issues    blood pressure, thyroid, blood sugar, Parkinson's    HPI: Patient is a 80 y.o. female seen in the clinic at [facility] today for evaluation of her chronic medical conditions  Problem List Items Addressed This Visit   Asthma exacerbation (Chronic)     Stabilized, chronic hacking cough, prn Neb is adequate     Relevant Medications      ipratropium-albuterol (DUONEB) 0.5-2.5 (3) MG/3ML SOLN   Edema     BLE--takes Torsemide 50mg  daily, only trace edema seen in ankles. Continue to monitor the patient. Bun/creat 26/0.88 11/26/12.         HTN (hypertension) - Primary     Controlled on Doxazosin 4mg  qd, Torsemide 50mg  qd, Atenolol 50mg  bid, and Losartan 100mg  daily. Clonidine 0.1mg  prn q8hr available to her.           Parkinson disease (Chronic)     Chin and hand tremor observed but not disabling, takes Sinemet 25/250mg  tid. Walks to and from dinning room for her room with walker x3/day          Peripheral neuropathy     Burning sensation in her feet--able to sleep through night 9pm-7am with x1 bathroom trip in average. Takes Gabapentin 600mg  bid.           Type II or unspecified type diabetes mellitus without mention of complication, not stated as uncontrolled (Chronic)     Controlled, continue Glimepiride 2mg  daily and diet modification.       Unspecified hypothyroidism     Takes Levothyroxine 139mcg M, W, F, last TSH 1.499 08/27/12           Review of Systems:  Review of Systems  Constitutional: Negative for fever, chills, weight loss (weight gain), malaise/fatigue and diaphoresis.  HENT: Positive for hearing loss (mild). Negative for congestion, ear discharge, ear pain,  nosebleeds, sore throat and tinnitus.   Eyes: Negative for blurred vision, double vision, photophobia, pain, discharge and redness.  Respiratory: Positive for cough (chronic). Negative for hemoptysis, sputum production, shortness of breath, wheezing and stridor.        Chronic, occasional, livable.   Cardiovascular: Positive for leg swelling. Negative for chest pain, palpitations, orthopnea, claudication and PND.  Gastrointestinal: Positive for constipation (prn MiraLax is adequate). Negative for heartburn, nausea, vomiting, abdominal pain, diarrhea, blood in stool and melena.  Genitourinary: Positive for frequency (after she takes am Torsemide). Negative for dysuria, urgency, hematuria and flank pain.  Musculoskeletal: Positive for joint pain. Negative for back pain, falls, myalgias and neck pain.  Skin: Negative for itching and rash.       There is chronic skin lesion anterior right lower leg--been followed by her Dermatology  Neurological: Positive for tremors and sensory change. Negative for dizziness, tingling, speech change, focal weakness, seizures, loss of consciousness, weakness (her usual state of ambulating with walker to and from her room to dinning room 3x/day) and headaches.  Endo/Heme/Allergies: Negative for environmental allergies and polydipsia. Does not bruise/bleed easily.  Psychiatric/Behavioral: Positive for depression (flat affect and negatitive) and memory loss. Negative for suicidal ideas, hallucinations and substance abuse. The patient is not nervous/anxious and does not have insomnia.  Past Medical History  Diagnosis Date  . Diabetes mellitus without complication   . Leukemia, chronic lymphoid   . Paralysis agitans   . Neuropathy   . Hypertension   . Lumbar stenosis   . Abnormal gait   . Parkinson disease    Past Surgical History  Procedure Laterality Date  . Cholecystectomy  07/2003   Social History:   reports that she has never smoked. She has never used  smokeless tobacco. She reports that she does not drink alcohol or use illicit drugs.  Family History  Problem Relation Age of Onset  . Kidney disease Mother   . Heart disease Father     MI    Medications: Patient's Medications  New Prescriptions   No medications on file  Previous Medications   ACETAMINOPHEN (TYLENOL) 325 MG TABLET    Take 650 mg by mouth. Take 2 tablets every 8 hours as needed for pain   ATENOLOL (TENORMIN) 50 MG TABLET    Take 50 mg by mouth 2 (two) times daily.     CARBIDOPA-LEVODOPA (SINEMET IR) 25-250 MG PER TABLET    Take 1 tablet by mouth 3 (three) times daily.   CHOLECALCIFEROL (VITAMIN D) 2000 UNITS TABLET    Take 2,000 Units by mouth daily.   CLONIDINE (CATAPRES) 0.1 MG TABLET    Take 0.1 mg by mouth every 8 (eight) hours as needed. For blood pressure   DOXAZOSIN (CARDURA) 4 MG TABLET    Take 4 mg by mouth at bedtime.   GABAPENTIN (NEURONTIN) 600 MG TABLET    Take 600 mg by mouth 2 (two) times daily.   GLIMEPIRIDE (AMARYL) 2 MG TABLET    Take 2 mg by mouth daily before breakfast.   IPRATROPIUM-ALBUTEROL (DUONEB) 0.5-2.5 (3) MG/3ML SOLN    Take 3 mLs by nebulization every 4 (four) hours as needed.   LEVOTHYROXINE (SYNTHROID, LEVOTHROID) 100 MCG TABLET    Take 100 mcg by mouth every Monday, Wednesday, and Friday. Takes 1 tablet on mondays, wednesdays and fridays   LOSARTAN (COZAAR) 100 MG TABLET    Take 100 mg by mouth at bedtime.     POLYETHYL GLYCOL-PROPYL GLYCOL (SYSTANE) 0.4-0.3 % SOLN    Place 1 drop into both eyes as needed. One drop in both eyes twice daily, for dry eyes   POLYETHYLENE GLYCOL (MIRALAX / GLYCOLAX) PACKET    Take 17 g by mouth daily as needed. For constipation   TORSEMIDE (DEMADEX) 100 MG TABLET    Take 100 mg by mouth daily. Take 1/2 tablet daily  Modified Medications   No medications on file  Discontinued Medications   No medications on file     Physical Exam:Physical Exam  Constitutional: She is oriented to person, place, and time.  She appears well-developed and well-nourished. No distress.  HENT:  Head: Normocephalic and atraumatic.  Right Ear: External ear normal.  Left Ear: External ear normal.  Nose: Nose normal.  Mouth/Throat: Oropharynx is clear and moist. No oropharyngeal exudate.  Eyes: Conjunctivae are normal. Pupils are equal, round, and reactive to light. Right eye exhibits no discharge. Left eye exhibits no discharge. No scleral icterus.  Neck: Normal range of motion. Neck supple. No JVD present. No tracheal deviation present. No thyromegaly present.  Cardiovascular: Normal rate, regular rhythm and normal heart sounds.   No murmur heard. Pulmonary/Chest: Effort normal. No stridor. No respiratory distress. She has no wheezes. She has no rales. She exhibits no tenderness.  Abdominal: Soft. Bowel sounds are normal.  She exhibits no distension. There is no tenderness. There is no rebound and no guarding.  Musculoskeletal: Normal range of motion. She exhibits edema. She exhibits no tenderness.  Lymphadenopathy:    She has no cervical adenopathy.  Neurological: She is alert and oriented to person, place, and time. She has normal reflexes. No cranial nerve deficit. She exhibits normal muscle tone. Coordination normal.  Tremor seen in her hands and chin.   Skin: Skin is warm and dry. No rash noted. She is not diaphoretic. No erythema. No pallor.  Anterior right shin skin lesion--Dermatology  Psychiatric: Her mood appears not anxious. Her affect is not angry, not blunt, not labile and not inappropriate. Her speech is not rapid and/or pressured, not delayed, not tangential and not slurred. She is not agitated, not aggressive, not hyperactive, not slowed, not withdrawn, not actively hallucinating and not combative. Thought content is not paranoid and not delusional. Cognition and memory are impaired. She does not express impulsivity. She exhibits a depressed mood (flat affect). She is communicative. She exhibits abnormal  recent memory. She exhibits normal remote memory.  Flat affect She is attentive.    Labs reviewed: Basic Metabolic Panel:  Recent Labs  08/26/12 11/26/12  NA 144 143  K 4.1 4.1  BUN 30* 26*  CREATININE 0.9 0.9  TSH 1.50  --    Liver Function Tests:  Recent Labs  08/26/12 11/26/12  AST 14 12*  ALT 8 8  ALKPHOS 99 85   CBC:  Recent Labs  08/26/12 11/26/12  WBC 6.5 6.9  HGB 13.5 12.5  HCT 39 36  PLT 142* 130*   Lipid Panel:  Recent Labs  08/26/12  CHOL 187  HDL 31*  LDLCALC 121  TRIG 177*   Past Procedures:  09/01/12 US Pelvis: s/p hysterectomy. Nether ovary visualized. No free intraperitoneal fluid or adnexal mass.   09/01/12 [place] abd: mild atrophy left kidney. Mild hepatomegaly. S/p cholecystectomy. Distal abdominal aorta not seen secondary to obscuring bowel gas.   10/22/12 X-ray R+L knees: no acute fracture, malalignment, or lytic destructive lesion. Small degenerative spur at the superior patella. No significant joint space narrowing.   11/23/12 CXR mild bronchitis at the mid and lower lungs bilaterally. Borderline cardiomegaly unchanged without pulmonary vascular congestion or pleural effusion. No inflammatory consolidate or suspicious nodule.    Assessment/Plan HTN (hypertension) Controlled on Doxazosin 4mg  qd, Torsemide 50mg  qd, Atenolol 50mg  bid, and Losartan 100mg  daily. Clonidine 0.1mg  prn q8hr available to her.         Parkinson disease Chin and hand tremor observed but not disabling, takes Sinemet 25/250mg  tid. Walks to and from dinning room for her room with walker x3/day        Unspecified hypothyroidism Takes Levothyroxine 110mcg M, W, F, last TSH 1.499 08/27/12      Peripheral neuropathy Burning sensation in her feet--able to sleep through night 9pm-7am with x1 bathroom trip in average. Takes Gabapentin 600mg  bid.         Type II or unspecified type diabetes mellitus without mention of complication, not stated as  uncontrolled Controlled, continue Glimepiride 2mg  daily and diet modification.     Asthma exacerbation Stabilized, chronic hacking cough, prn Neb is adequate   Edema BLE--takes Torsemide 50mg  daily, only trace edema seen in ankles. Continue to monitor the patient. Bun/creat 26/0.88 11/26/12.         Family/ Staff Communication: observe the patient.   Goals of Care: AL  Labs/tests ordered: none

## 2013-04-04 NOTE — Assessment & Plan Note (Signed)
Controlled on Doxazosin 4mg qd, Torsemide 50mg qd, Atenolol 50mg bid, and Losartan 100mg daily. Clonidine 0.1mg prn q8hr available to her.    

## 2013-04-04 NOTE — Assessment & Plan Note (Signed)
Chin and hand tremor observed but not disabling, takes Sinemet 25/250mg tid. Walks to and from dinning room for her room with walker x3/day   

## 2013-04-04 NOTE — Assessment & Plan Note (Signed)
Burning sensation in her feet--able to sleep through night 9pm-7am with x1 bathroom trip in average. Takes Gabapentin 600mg bid.    

## 2013-04-04 NOTE — Assessment & Plan Note (Signed)
Controlled, continue Glimepiride 2mg  daily and diet modification.

## 2013-04-04 NOTE — Assessment & Plan Note (Signed)
Takes Levothyroxine 176mcg M, W, F, last TSH 1.499 08/27/12

## 2013-04-04 NOTE — Assessment & Plan Note (Signed)
Stabilized, chronic hacking cough, prn Neb is adequate

## 2013-04-04 NOTE — Assessment & Plan Note (Signed)
BLE--takes Torsemide 50mg  daily, only trace edema seen in ankles. Continue to monitor the patient. Bun/creat 26/0.88 11/26/12.

## 2013-10-07 ENCOUNTER — Non-Acute Institutional Stay: Payer: Medicare Other | Admitting: Internal Medicine

## 2013-10-07 VITALS — BP 142/60 | HR 80 | Temp 97.9°F | Ht 67.0 in | Wt 206.0 lb

## 2013-10-07 DIAGNOSIS — I10 Essential (primary) hypertension: Secondary | ICD-10-CM

## 2013-10-07 DIAGNOSIS — C9110 Chronic lymphocytic leukemia of B-cell type not having achieved remission: Secondary | ICD-10-CM

## 2013-10-07 DIAGNOSIS — R609 Edema, unspecified: Secondary | ICD-10-CM

## 2013-10-07 DIAGNOSIS — R0989 Other specified symptoms and signs involving the circulatory and respiratory systems: Secondary | ICD-10-CM

## 2013-10-07 DIAGNOSIS — E039 Hypothyroidism, unspecified: Secondary | ICD-10-CM

## 2013-10-07 DIAGNOSIS — M150 Primary generalized (osteo)arthritis: Secondary | ICD-10-CM

## 2013-10-07 DIAGNOSIS — G20 Parkinson's disease: Secondary | ICD-10-CM

## 2013-10-07 DIAGNOSIS — M159 Polyosteoarthritis, unspecified: Secondary | ICD-10-CM

## 2013-10-07 DIAGNOSIS — K5900 Constipation, unspecified: Secondary | ICD-10-CM

## 2013-10-07 DIAGNOSIS — R635 Abnormal weight gain: Secondary | ICD-10-CM

## 2013-10-07 DIAGNOSIS — E119 Type 2 diabetes mellitus without complications: Secondary | ICD-10-CM

## 2013-10-07 DIAGNOSIS — R300 Dysuria: Secondary | ICD-10-CM | POA: Insufficient documentation

## 2013-10-07 DIAGNOSIS — G20A1 Parkinson's disease without dyskinesia, without mention of fluctuations: Secondary | ICD-10-CM

## 2013-10-07 DIAGNOSIS — R0600 Dyspnea, unspecified: Secondary | ICD-10-CM

## 2013-10-07 DIAGNOSIS — R0609 Other forms of dyspnea: Secondary | ICD-10-CM

## 2013-10-07 NOTE — Progress Notes (Signed)
Patient ID: Vanessa Carroll, female   DOB: [birth year], 80 y.o.   MRN: [id number]    Location:  [facility] (12)  Extended Emergency Contact Information Primary Emergency Contact: Blickenstaff,Jerry Address: [address], [place] Phone: [phone] Work Phone: [phone] Relation: Son Secondary Emergency Contact: Dicenzo,Charles Address: [address]          [place], [place] [address] [place] Phone: [phone] Work Phone: [phone] Relation: Son  Advanced Directive information Does patient have an advance directive?: Yes, Type of Advance Directive: Living will, Does patient want to make changes to advanced directive?: No - Patient declined   Chief Complaint  Patient presents with  . Medical Management of Chronic Issues    Comprehensive exam: blood pressure, blood sugar, depression, thyroid, Parkinson.  Here with son Sonia Side   . Shortness of Breath    per son, seems to be worse  . Constipation    then periods of diarrhea for past 3 months.     HPI:   Dyspnea: Short of breath with minimal activity according to her son. This extends back about 3 months.   Essential hypertension: Controlled   Parkinson disease: Unchanged   Type II or unspecified type diabetes mellitus without mention of complication, not stated as uncontrolled: Controlled. Glucose 105 on recent lab .  Unspecified hypothyroidism: Compensated   CLL (chronic lymphocytic leukemia): Stable  Edema: Unchanged pedal edema   Weight gain: Lost about 7 pounds since February 2015   Constipation: Chronic and unchanged .son reports she has episodes of constipation and then periods of diarrhea. She is on laxatives.     Past Medical History  Diagnosis Date  . Diabetes mellitus without complication   . Leukemia, chronic lymphoid   . Paralysis agitans   . Neuropathy   . Hypertension   . Lumbar stenosis   .  Abnormal gait   . Parkinson disease     Past Surgical History  Procedure Laterality Date  . Cholecystectomy  07/2003  . Appendectomy    . Bone marrow biopsy  08/16/2003    Dr. Jana Hakim  . Colonoscopy  2003    normal Dr. Oletta Lamas    History   Social History  . Marital Status: Widowed    Spouse Name: N/A    Number of Children: N/A  . Years of Education: N/A   Occupational History  . homemaker    Social History Main Topics  . Smoking status: Never Smoker   . Smokeless tobacco: Never Used  . Alcohol Use: No  . Drug Use: No  . Sexual Activity: No   Other Topics Concern  . Not on file   Social History Narrative   Lives at [facility] since 2000 in [place], moved to [place] 12/2010   Widowed   Living Will   Never smoked   Exercise none   Walks with walker     reports that she has never smoked. She has never used smokeless tobacco. She reports that she does not drink alcohol or use illicit drugs.   There is no immunization history on file for this patient.  Allergies  Allergen Reactions  . Codeine Nausea And Vomiting  . Sulfa Antibiotics Swelling    Medications: Patient's Medications  New Prescriptions   No medications on file  Previous Medications   ACETAMINOPHEN (TYLENOL) 325 MG TABLET  Take 650 mg by mouth. Take 2 tablets every 8 hours as needed for pain   ATENOLOL (TENORMIN) 50 MG TABLET    Take 50 mg by mouth 2 (two) times daily.     CARBIDOPA-LEVODOPA (SINEMET IR) 25-250 MG PER TABLET    Take 1 tablet by mouth 3 (three) times daily.   CHOLECALCIFEROL (VITAMIN D) 2000 UNITS TABLET    Take 2,000 Units by mouth daily.   CLONIDINE (CATAPRES) 0.1 MG TABLET    Take 0.1 mg by mouth every 8 (eight) hours as needed. For blood pressure   DOXAZOSIN (CARDURA) 4 MG TABLET    Take 4 mg by mouth at bedtime.   GABAPENTIN (NEURONTIN) 600 MG TABLET    Take 600 mg by mouth 2 (two) times daily.   GLIMEPIRIDE (AMARYL) 2 MG TABLET    Take 2 mg by mouth daily before  breakfast.   IPRATROPIUM-ALBUTEROL (DUONEB) 0.5-2.5 (3) MG/3ML SOLN    Take 3 mLs by nebulization every 4 (four) hours as needed.   LEVOTHYROXINE (SYNTHROID, LEVOTHROID) 100 MCG TABLET    Take 100 mcg by mouth every Monday, Wednesday, and Friday. Takes 1 tablet on mondays, wednesdays and fridays   LOSARTAN (COZAAR) 100 MG TABLET    Take 100 mg by mouth at bedtime.     POLYETHYL GLYCOL-PROPYL GLYCOL (SYSTANE) 0.4-0.3 % SOLN    Place 1 drop into both eyes as needed. One drop in both eyes twice daily, for dry eyes   POLYETHYLENE GLYCOL (MIRALAX / GLYCOLAX) PACKET    Take 17 g by mouth daily as needed. For constipation. Take on M-W-F   TORSEMIDE (DEMADEX) 100 MG TABLET    Take 100 mg by mouth daily. Take 1/2 tablet daily  Modified Medications   No medications on file  Discontinued Medications   No medications on file     Review of Systems  Constitutional: Positive for fatigue. Negative for fever, diaphoresis, activity change, appetite change and unexpected weight change.  HENT: Negative for ear pain, hearing loss, postnasal drip, rhinorrhea, trouble swallowing and voice change.   Eyes: Positive for visual disturbance (corrective lenses). Negative for discharge and itching.  Respiratory: Positive for shortness of breath. Negative for cough, chest tightness and wheezing.   Cardiovascular: Positive for leg swelling. Negative for chest pain and palpitations.  Gastrointestinal: Positive for diarrhea and constipation (using miralax 3 times weekly).  Endocrine: Negative for cold intolerance, heat intolerance, polydipsia, polyphagia and polyuria.  Genitourinary: Positive for dysuria. Negative for vaginal bleeding and vaginal pain.       Urine incontinence  Musculoskeletal: Positive for back pain.       Left hip pain  Skin: Negative.   Allergic/Immunologic: Negative.   Neurological: Positive for dizziness and weakness. Negative for seizures, speech difficulty, numbness and headaches.    Psychiatric/Behavioral: Positive for dysphoric mood. Negative for confusion and sleep disturbance. The patient is not nervous/anxious and is not hyperactive.     Filed Vitals:   10/07/13 1525  BP: 142/60  Pulse: 80  Temp: 97.9 F (36.6 C)  TempSrc: Oral  Height: _0  (1.702 m)  Weight: 206 lb (93.441 kg)  SpO2: 93%   Body mass index is 32.26 kg/(m^2).  Physical Exam  Constitutional: She is oriented to person, place, and time. She appears well-developed and well-nourished. No distress.  HENT:  Head: Normocephalic and atraumatic.  Right Ear: External ear normal.  Left Ear: External ear normal.  Nose: Nose normal.  Mouth/Throat: Oropharynx is clear and moist. No  oropharyngeal exudate.  Eyes: Conjunctivae are normal. Pupils are equal, round, and reactive to light. Right eye exhibits no discharge. Left eye exhibits no discharge. No scleral icterus.  Neck: Normal range of motion. Neck supple. No JVD present. No tracheal deviation present. No thyromegaly present.  Cardiovascular: Normal rate, regular rhythm and normal heart sounds.   No murmur heard. Pulmonary/Chest: Effort normal. No stridor. No respiratory distress. She has no wheezes. She has no rales. She exhibits no tenderness.  Abdominal: Soft. Bowel sounds are normal. She exhibits no distension and no mass. There is tenderness (LLQ). There is no rebound and no guarding.  Genitourinary:  Deferred at patient request  Musculoskeletal: Normal range of motion. She exhibits edema and tenderness (lower back and knees).  Lymphadenopathy:    She has no cervical adenopathy.  Neurological: She is alert and oriented to person, place, and time. She has normal reflexes. No cranial nerve deficit. She exhibits normal muscle tone. Coordination normal.  Tremor seen in her hands and chin.   Skin: Skin is warm and dry. No rash noted. She is not diaphoretic. No erythema. No pallor.  Psychiatric: Her mood appears not anxious. Her affect is not  angry, not blunt, not labile and not inappropriate. Her speech is not rapid and/or pressured, not delayed, not tangential and not slurred. She is not agitated, not aggressive, not hyperactive, not slowed, not withdrawn, not actively hallucinating and not combative. Thought content is not paranoid and not delusional. Cognition and memory are impaired. She does not express impulsivity. She exhibits a depressed mood (flat affect). She is communicative. She exhibits abnormal recent memory. She exhibits normal remote memory.  Flat affect She is attentive.     Labs reviewed: No visits with results within 3 Month(s) from this visit. Latest known visit with results is:  Nursing Home on 12/10/2012  Component Date Value Ref Range Status  . Hemoglobin 11/26/2012 12.5  12.0 - 16.0 g/dL Final  . HCT 11/26/2012 36  36 - 46 % Final  . Platelets 11/26/2012 130* 150 - 399 K/L Final  . WBC 11/26/2012 6.9   Final  . Glucose 11/26/2012 105   Final  . BUN 11/26/2012 26* 4 - 21 mg/dL Final  . Creatinine 11/26/2012 0.9  0.5 - 1.1 mg/dL Final  . Potassium 11/26/2012 4.1  3.4 - 5.3 mmol/L Final  . Sodium 11/26/2012 143  137 - 147 mmol/L Final  . Alkaline Phosphatase 11/26/2012 85  25 - 125 U/L Final  . ALT 11/26/2012 8  7 - 35 U/L Final  . AST 11/26/2012 12* 13 - 35 U/L Final  . Bilirubin, Total 11/26/2012 0.7   Final    Assessment/Plan  1. Dyspnea Observe for now. Does not seem to be in congestive heart failure. She is 80 years of age. Suspect dyspnea is related to aging of her lungs.  2. Essential hypertension Controlled  3. Parkinson disease Patient has a bilateral tremor at rest. It is not increasing. It does not seem to interfere with current life.  4. Type II or unspecified type diabetes mellitus without mention of complication, not stated as uncontrolled Controlled  5. Unspecified hypothyroidism Compensated  6. CLL (chronic lymphocytic leukemia) Continue to monitor. Currently on  treatment.  7. Edema Unchanged  8. Weight gain Now losing weight  9. Constipation Try Senokot 2 tablets nightly  10. Dysuria Requested urinalysis and culture next visit  11. Primary osteoarthritis involving multiple joints Try meloxicam 15 mg daily      lab  prior to next visit: CBC, CMP TSH and A1c urinalysis and culture,

## 2013-10-12 LAB — CBC AND DIFFERENTIAL
HCT: 34 % — AB (ref 36–46)
HEMOGLOBIN: 11.6 g/dL — AB (ref 12.0–16.0)
Platelets: 160 10*3/uL (ref 150–399)
WBC: 8.2 10*3/mL

## 2013-10-12 LAB — HEPATIC FUNCTION PANEL
ALT: 8 U/L (ref 7–35)
AST: 12 U/L — AB (ref 13–35)
Alkaline Phosphatase: 117 U/L (ref 25–125)
Bilirubin, Total: 0.9 mg/dL

## 2013-10-12 LAB — BASIC METABOLIC PANEL
BUN: 33 mg/dL — AB (ref 4–21)
CREATININE: 1.2 mg/dL — AB (ref 0.5–1.1)
Glucose: 98 mg/dL
POTASSIUM: 4.2 mmol/L (ref 3.4–5.3)
SODIUM: 144 mmol/L (ref 137–147)

## 2013-10-12 LAB — TSH: TSH: 2.25 u[IU]/mL (ref 0.41–5.90)

## 2013-10-12 LAB — HEMOGLOBIN A1C: Hgb A1c MFr Bld: 4.1 % (ref 4.0–6.0)

## 2013-10-14 ENCOUNTER — Other Ambulatory Visit: Payer: Self-pay | Admitting: Nurse Practitioner

## 2013-10-14 DIAGNOSIS — E538 Deficiency of other specified B group vitamins: Secondary | ICD-10-CM

## 2013-10-14 DIAGNOSIS — E119 Type 2 diabetes mellitus without complications: Secondary | ICD-10-CM

## 2013-10-14 DIAGNOSIS — E039 Hypothyroidism, unspecified: Secondary | ICD-10-CM

## 2013-11-04 ENCOUNTER — Encounter: Payer: Self-pay | Admitting: Nurse Practitioner

## 2013-11-10 IMAGING — US US RENAL
1 series · 14 of 25 positions shown · non-contrast
Comparison: MRI abdomen 08/30/2009.

CLINICAL DATA: Hypertension.  History of bladder cancer.

RENAL/URINARY TRACT ULTRASOUND COMPLETE

[Series 1: us renal · 0.27mm/px · 14 of 39 slices shown]
[im 1/39]
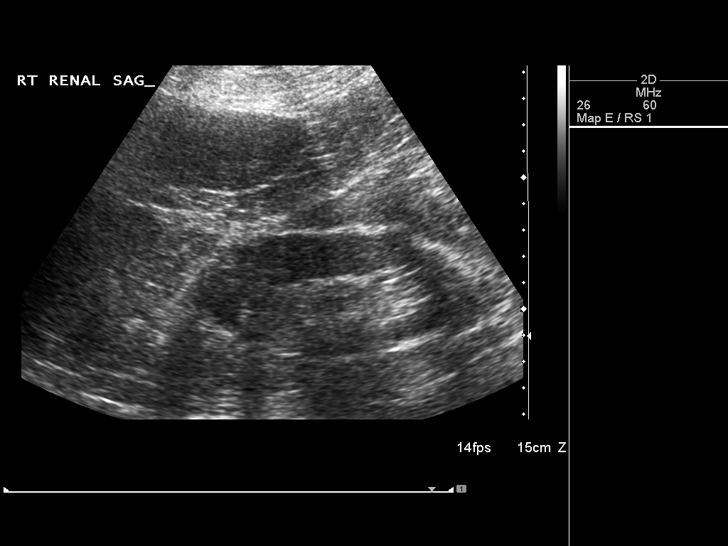
[im 4/39]
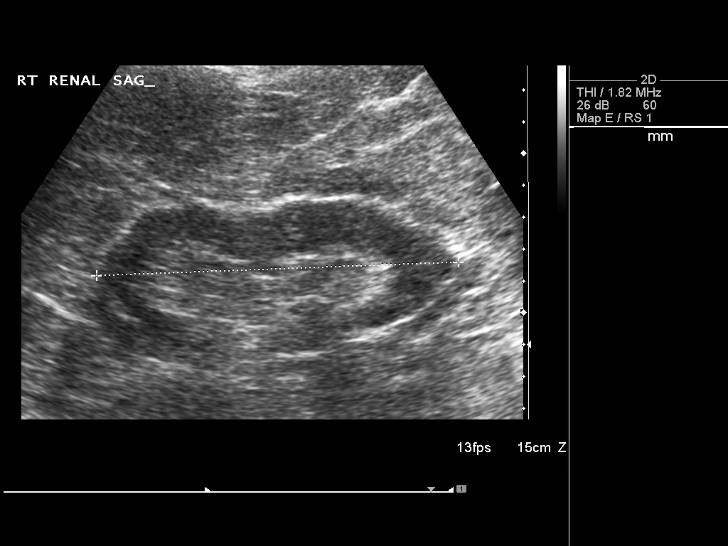
[im 7/39]
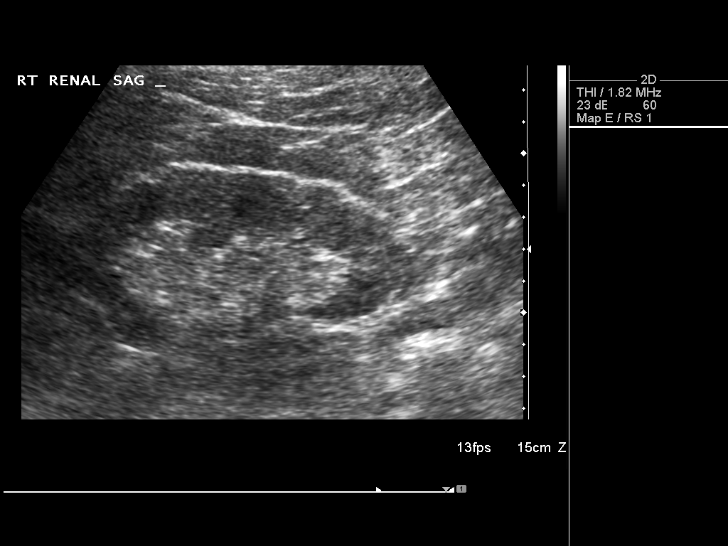
[im 10/39]
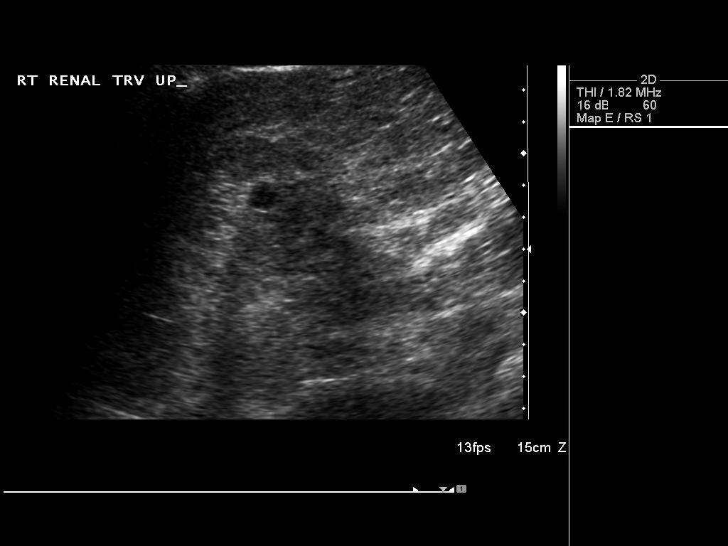
[im 13/39]
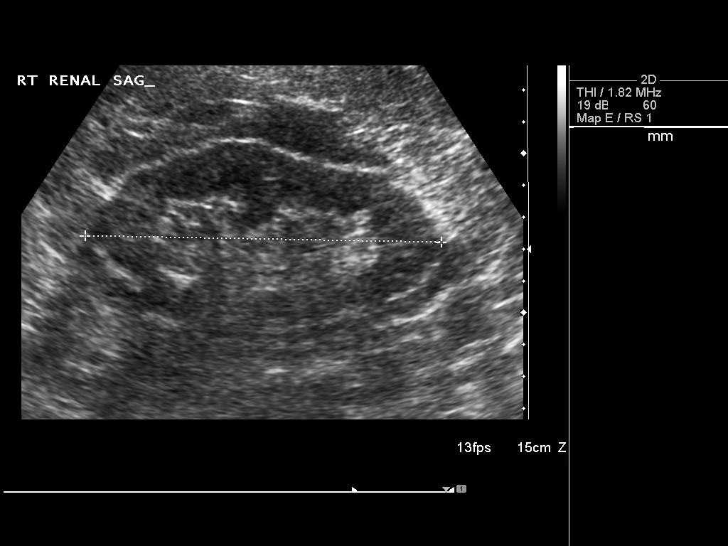
[im 15/39]
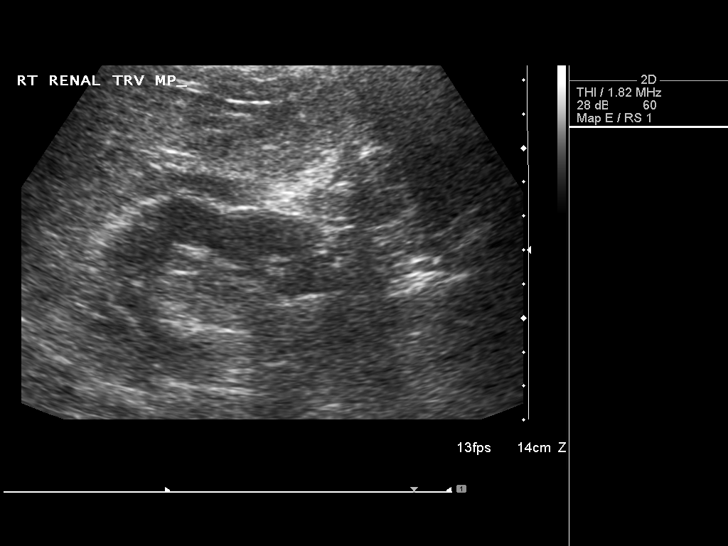
[im 18/39]
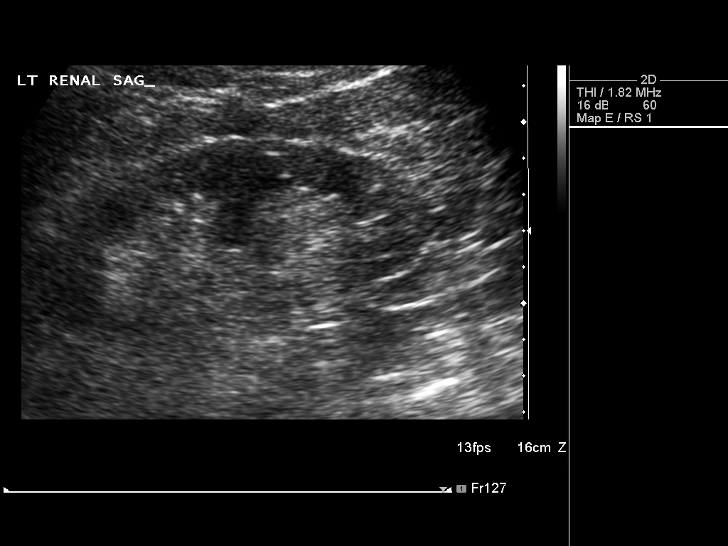
[im 21/39]
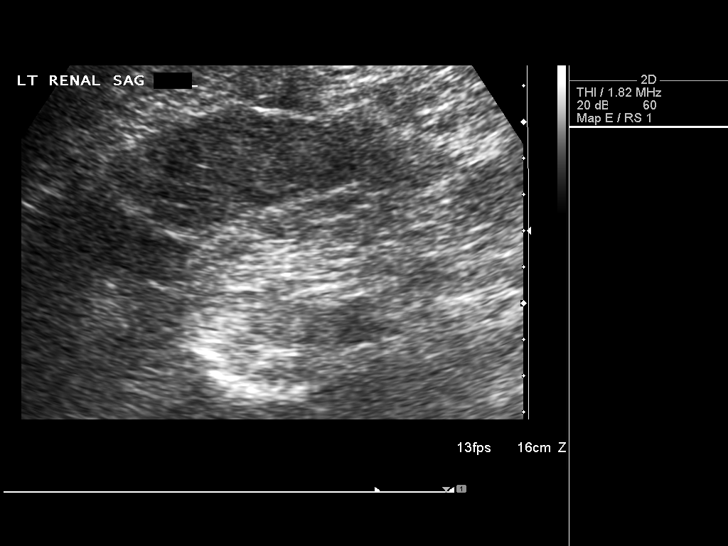
[im 24/39]
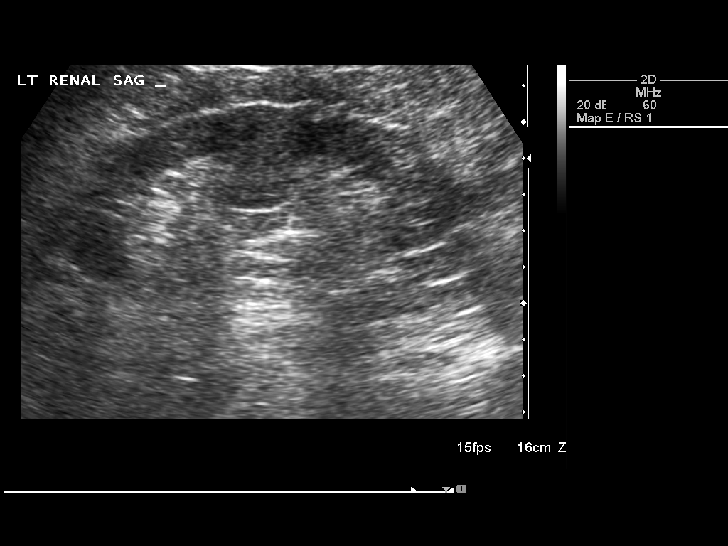
[im 26/39]
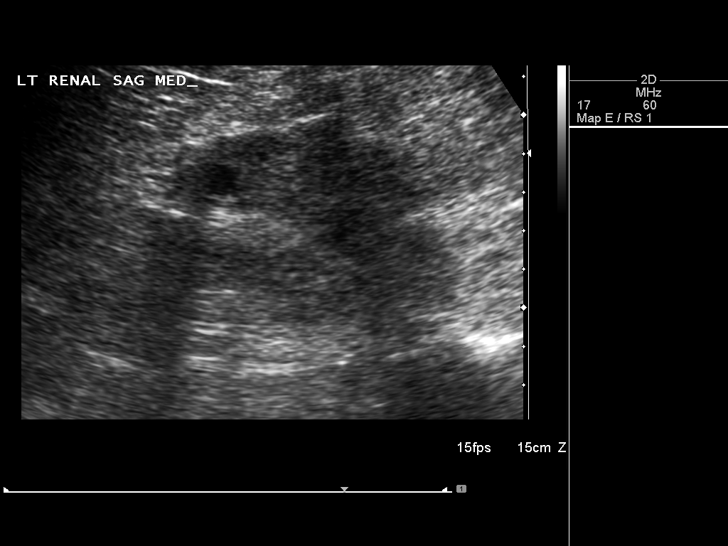
[im 29/39]
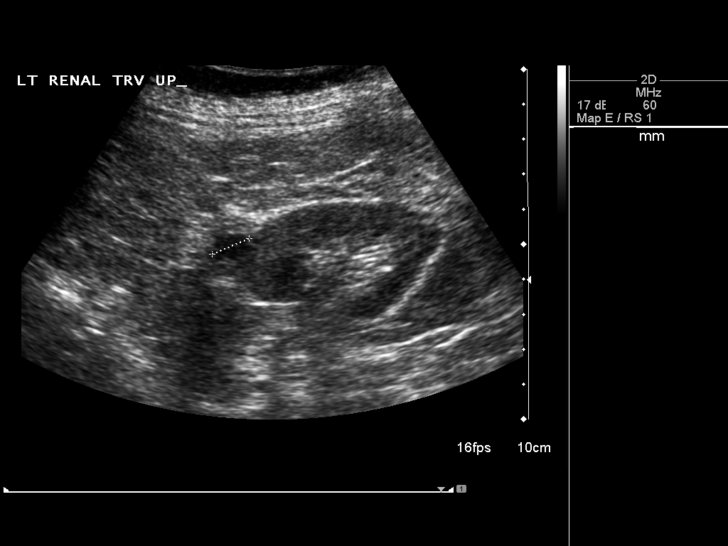
[im 32/39]
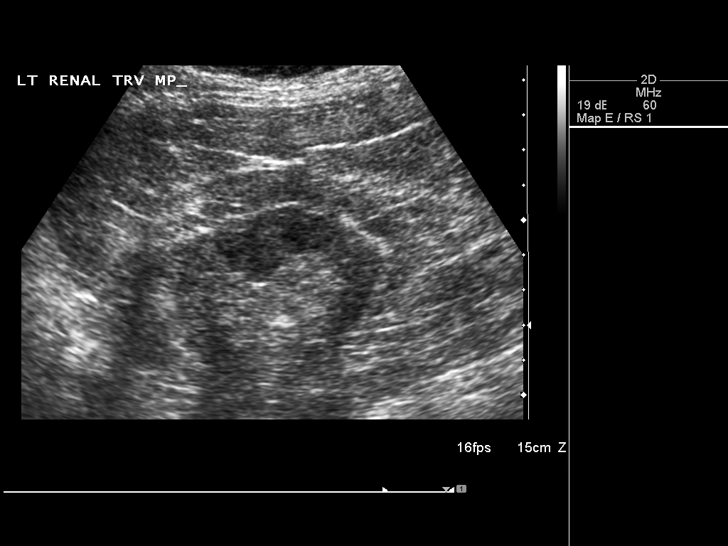
[im 35/39]
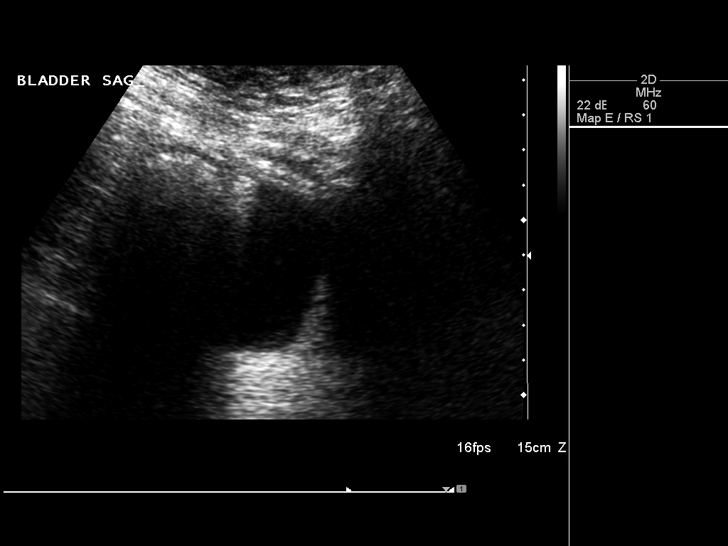
[im 39/39]
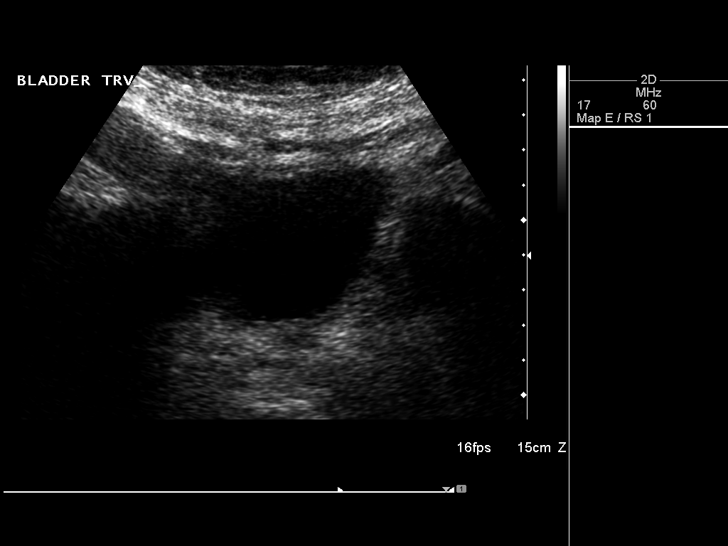

[14 of 25 positions shown; findings below may reference images not displayed]

FINDINGS: Right Kidney:  11.4 cm in length. Normal renal cortical thickness
and echogenicity without focal lesions or hydronephrosis..  Stable
upper pole cyst.

Left Kidney:  11.3 cm in length. Normal renal cortical thickness
and echogenicity without focal lesions or hydronephrosis.  Stable
upper pole renal cyst.

Bladder:  Normal.
IMPRESSION: Unremarkable renal ultrasound examination.

## 2013-11-11 ENCOUNTER — Encounter: Payer: Self-pay | Admitting: Nurse Practitioner

## 2013-11-16 ENCOUNTER — Encounter: Payer: Self-pay | Admitting: Internal Medicine

## 2013-11-25 ENCOUNTER — Encounter: Payer: Self-pay | Admitting: Nurse Practitioner

## 2013-12-23 ENCOUNTER — Encounter: Payer: Medicare Other | Admitting: Nurse Practitioner

## 2014-01-03 ENCOUNTER — Non-Acute Institutional Stay: Payer: Medicare Other | Admitting: Nurse Practitioner

## 2014-01-03 ENCOUNTER — Encounter: Payer: Self-pay | Admitting: Nurse Practitioner

## 2014-01-03 DIAGNOSIS — G20 Parkinson's disease: Secondary | ICD-10-CM

## 2014-01-03 DIAGNOSIS — E538 Deficiency of other specified B group vitamins: Secondary | ICD-10-CM

## 2014-01-03 DIAGNOSIS — G629 Polyneuropathy, unspecified: Secondary | ICD-10-CM

## 2014-01-03 DIAGNOSIS — E1140 Type 2 diabetes mellitus with diabetic neuropathy, unspecified: Secondary | ICD-10-CM

## 2014-01-03 DIAGNOSIS — J189 Pneumonia, unspecified organism: Secondary | ICD-10-CM

## 2014-01-03 DIAGNOSIS — R609 Edema, unspecified: Secondary | ICD-10-CM

## 2014-01-03 DIAGNOSIS — J45901 Unspecified asthma with (acute) exacerbation: Secondary | ICD-10-CM

## 2014-01-03 DIAGNOSIS — K5900 Constipation, unspecified: Secondary | ICD-10-CM

## 2014-01-03 DIAGNOSIS — I509 Heart failure, unspecified: Secondary | ICD-10-CM | POA: Insufficient documentation

## 2014-01-03 DIAGNOSIS — I10 Essential (primary) hypertension: Secondary | ICD-10-CM

## 2014-01-03 DIAGNOSIS — E039 Hypothyroidism, unspecified: Secondary | ICD-10-CM

## 2014-01-03 NOTE — Assessment & Plan Note (Signed)
Takes Levothyroxine 124mcg M, W, F,  TSH 1.499 08/27/12. 10/12/13 TSH 2.251

## 2014-01-03 NOTE — Assessment & Plan Note (Signed)
Controlled on Doxazosin 4mg  qd, Torsemide 50mg  qd, Atenolol 50mg  bid, and Losartan 100mg  daily. Clonidine 0.1mg  prn q8hr available to her.

## 2014-01-03 NOTE — Progress Notes (Signed)
Patient ID: Vanessa Carroll, female   DOB: 11-27-1920, 80 y.o.   MRN: [id number]   Code Status: DNR  Allergies  Allergen Reactions  . Codeine Nausea And Vomiting  . Sulfa Antibiotics Swelling    Chief Complaint  Patient presents with  . Medical Management of Chronic Issues  . Acute Visit    cough/O2 desaturation, yellow/greenish phlegm.     HPI: Patient is a 80 y.o. female seen in the [place] at [facility] today for evaluation of cough, O2 desaturation, and other chronic medical conditions  Problem List Items Addressed This Visit    Vitamin B12 deficiency    10/12/13 Hgb 11.6-off  Vitamin B12 1087mg po daily     Type 2 diabetes mellitus with diabetic neuropathy (Chronic)    10/12/13 Hgb A1c 4.1 11/04/13 dc Glimepiride 297mpo qam, continue CBG 2x/wk for now.      PNA (pneumonia)    01/02/14 cough and O2 desaturation-CXR 01/02/14 showed ? PNA-will complete Z-pk. Adding Avelox 40063m 2 weeks. medro dose pk. Mucinex 600m65md x 1wk. Schedule DuoNeb q6hr for 1 week then prn as prior. O2 2lpm to maintain O2 Sat >90%.     Peripheral neuropathy    Burning sensation in her feet--able to sleep through night 9pm-7am with x1 bathroom trip in average. Takes Gabapentin 600mg90m.       Parkinson disease (Chronic)    Chin and hand tremor observed but not disabling, takes Sinemet 25/250mg 44m Walks to and from dinning room for her room with walker x3/day      Hypothyroidism (Chronic)    Takes Levothyroxine 100mcg 47m, F,  TSH 1.499 08/27/12. 10/12/13 TSH 2.251        HTN (hypertension) (Chronic)    Controlled on Doxazosin 4mg qd,40mrsemide 50mg qd,61mnolol 50mg bid,70m Losartan 100mg daily60monidine 0.1mg prn q8h67mvailable to her.       Edema    BLE--increase Torsemide 60mg daily i61mting of cough, O2 desaturation, CXR showed CHF. Update BMP and BNP       Constipation    Stable. 11/04/13 changed Senokot II from qhs for hs prn.      Congestive heart failure -  Primary    01/02/14 CXR moderate cardiomegaly with slight elevation of the pulmonary vasculature suggests mild congestive heart failure, patchy non consolidated interstitial changes both lower lungs. The differential diagnosis includes pneumonia, interstitial edema, and areas of subsegmental atelectasis.  01/03/14 update BMP and BNP. Increase Torsemide to 60mg daily. C39mete Z-pk     Asthma exacerbation (Chronic)    chronic hacking cough, prn Neb is adequate         Review of Systems:  Review of Systems  Constitutional: Negative for fever, chills, weight loss (weight gain), malaise/fatigue and diaphoresis.  HENT: Positive for hearing loss (mild). Negative for congestion, ear discharge, ear pain, nosebleeds, sore throat and tinnitus.   Eyes: Negative for blurred vision, double vision, photophobia, pain, discharge and redness.  Respiratory: Positive for cough (chronic), sputum production, shortness of breath and wheezing. Negative for hemoptysis and stridor.        Worsened cough and yellow/greeniush sputum   Cardiovascular: Positive for leg swelling. Negative for chest pain, palpitations, orthopnea, claudication and PND.  Gastrointestinal: Positive for constipation (prn MiraLax is adequate). Negative for heartburn, nausea, vomiting, abdominal pain, diarrhea, blood in stool and melena.  Genitourinary: Positive for frequency (after she takes am Torsemide). Negative for dysuria, urgency, hematuria and flank pain.  Musculoskeletal: Positive for joint pain. Negative for myalgias, back pain, falls and neck pain.  Skin: Negative for itching and rash.       There is chronic skin lesion anterior right lower leg--been followed by her Dermatology  Neurological: Positive for tremors and sensory change. Negative for dizziness, tingling, speech change, focal weakness, seizures, loss of consciousness, weakness (her usual state of ambulating with walker to and from her room to dinning room 3x/day) and  headaches.  Endo/Heme/Allergies: Negative for environmental allergies and polydipsia. Does not bruise/bleed easily.  Psychiatric/Behavioral: Positive for depression (flat affect and negatitive) and memory loss. Negative for suicidal ideas, hallucinations and substance abuse. The patient is not nervous/anxious and does not have insomnia.      Past Medical History  Diagnosis Date  . Diabetes mellitus without complication   . Leukemia, chronic lymphoid   . Paralysis agitans   . Neuropathy   . Hypertension   . Lumbar stenosis   . Abnormal gait   . Parkinson disease    Past Surgical History  Procedure Laterality Date  . Cholecystectomy  07/2003  . Appendectomy    . Bone marrow biopsy  08/16/2003    Dr. Jana Hakim  . Colonoscopy  2003    normal Dr. Oletta Lamas   Social History:   reports that she has never smoked. She has never used smokeless tobacco. She reports that she does not drink alcohol or use illicit drugs.  Family History  Problem Relation Age of Onset  . Kidney disease Mother   . Heart disease Father     MI  . Heart disease Daughter     Medications: Patient's Medications  New Prescriptions   No medications on file  Previous Medications   ACETAMINOPHEN (TYLENOL) 325 MG TABLET    Take 650 mg by mouth. Take 2 tablets every 8 hours as needed for pain   ATENOLOL (TENORMIN) 50 MG TABLET    Take 50 mg by mouth 2 (two) times daily.     CARBIDOPA-LEVODOPA (SINEMET IR) 25-250 MG PER TABLET    Take 1 tablet by mouth 3 (three) times daily.   CHOLECALCIFEROL (VITAMIN D) 2000 UNITS TABLET    Take 2,000 Units by mouth daily.   CLONIDINE (CATAPRES) 0.1 MG TABLET    Take 0.1 mg by mouth every 8 (eight) hours as needed. For blood pressure   DOXAZOSIN (CARDURA) 4 MG TABLET    Take 4 mg by mouth at bedtime.   GABAPENTIN (NEURONTIN) 600 MG TABLET    Take 600 mg by mouth 2 (two) times daily.   GLIMEPIRIDE (AMARYL) 2 MG TABLET    Take 2 mg by mouth daily before breakfast.    IPRATROPIUM-ALBUTEROL (DUONEB) 0.5-2.5 (3) MG/3ML SOLN    Take 3 mLs by nebulization every 4 (four) hours as needed.   LEVOTHYROXINE (SYNTHROID, LEVOTHROID) 100 MCG TABLET    Take 100 mcg by mouth every Monday, Wednesday, and Friday. Takes 1 tablet on mondays, wednesdays and fridays   LOSARTAN (COZAAR) 100 MG TABLET    Take 100 mg by mouth at bedtime.     POLYETHYL GLYCOL-PROPYL GLYCOL (SYSTANE) 0.4-0.3 % SOLN    Place 1 drop into both eyes as needed. One drop in both eyes twice daily, for dry eyes   POLYETHYLENE GLYCOL (MIRALAX / GLYCOLAX) PACKET    Take 17 g by mouth daily as needed. For constipation. Take on M-W-F   TORSEMIDE (DEMADEX) 100 MG TABLET    Take 100 mg by mouth daily. Take 1/2  tablet daily  Modified Medications   No medications on file  Discontinued Medications   No medications on file     Physical Exam:Physical Exam  Constitutional: She is oriented to person, place, and time. She appears well-developed and well-nourished. No distress.  HENT:  Head: Normocephalic and atraumatic.  Right Ear: External ear normal.  Left Ear: External ear normal.  Nose: Nose normal.  Mouth/Throat: Oropharynx is clear and moist. No oropharyngeal exudate.  Eyes: Conjunctivae are normal. Pupils are equal, round, and reactive to light. Right eye exhibits no discharge. Left eye exhibits no discharge. No scleral icterus.  Neck: Normal range of motion. Neck supple. No JVD present. No tracheal deviation present. No thyromegaly present.  Cardiovascular: Normal rate, regular rhythm and normal heart sounds.   No murmur heard. Pulmonary/Chest: Effort normal. No stridor. No respiratory distress. She has wheezes. She has rales. She exhibits no tenderness.  Bibasilar moist rales and expiratory wheezes and diffused rhonchi.   Abdominal: Soft. Bowel sounds are normal. She exhibits no distension. There is no tenderness. There is no rebound and no guarding.  Musculoskeletal: Normal range of motion. She exhibits  edema. She exhibits no tenderness.  Worsened edema BLE 2+  Lymphadenopathy:    She has no cervical adenopathy.  Neurological: She is alert and oriented to person, place, and time. She has normal reflexes. No cranial nerve deficit. She exhibits normal muscle tone. Coordination normal.  Tremor seen in her hands and chin.   Skin: Skin is warm and dry. No rash noted. She is not diaphoretic. No erythema. No pallor.  Anterior right shin skin lesion--Dermatology  Psychiatric: Her mood appears not anxious. Her affect is not angry, not blunt, not labile and not inappropriate. Her speech is not rapid and/or pressured, not delayed, not tangential and not slurred. She is not agitated, not aggressive, not hyperactive, not slowed, not withdrawn, not actively hallucinating and not combative. Thought content is not paranoid and not delusional. Cognition and memory are impaired. She does not express impulsivity. She exhibits a depressed mood (flat affect). She is communicative. She exhibits abnormal recent memory. She exhibits normal remote memory.  Flat affect She is attentive.    Labs reviewed: Basic Metabolic Panel:  Recent Labs  10/12/13  NA 144  K 4.2  BUN 33*  CREATININE 1.2*  TSH 2.25   Liver Function Tests:  Recent Labs  10/12/13  AST 12*  ALT 8  ALKPHOS 117   CBC:  Recent Labs  10/12/13  WBC 8.2  HGB 11.6*  HCT 34*  PLT 160   Lipid Panel: No results for input(s): CHOL, HDL, LDLCALC, TRIG, CHOLHDL, LDLDIRECT in the last 8760 hours. Past Procedures:  09/01/12 US Pelvis: s/p hysterectomy. Nether ovary visualized. No free intraperitoneal fluid or adnexal mass.   09/01/12 [place] abd: mild atrophy left kidney. Mild hepatomegaly. S/p cholecystectomy. Distal abdominal aorta not seen secondary to obscuring bowel gas.   10/22/12 X-ray R+L knees: no acute fracture, malalignment, or lytic destructive lesion. Small degenerative spur at the superior patella. No significant joint space narrowing.    11/23/12 CXR mild bronchitis at the mid and lower lungs bilaterally. Borderline cardiomegaly unchanged without pulmonary vascular congestion or pleural effusion. No inflammatory consolidate or suspicious nodule.   01/02/14 CXR moderate cardiomegaly with slight elevation of the pulmonary vasculature suggests mild congestive heart failure, patchy non consolidated interstitial changes both lower lungs. The differential diagnosis includes pneumonia, interstitial edema, and areas of subsegmental atelectasis.    Assessment/Plan Congestive heart failure 01/02/14  CXR moderate cardiomegaly with slight elevation of the pulmonary vasculature suggests mild congestive heart failure, patchy non consolidated interstitial changes both lower lungs. The differential diagnosis includes pneumonia, interstitial edema, and areas of subsegmental atelectasis.  01/03/14 update BMP and BNP. Increase Torsemide to 62m daily. Complete Z-pk   Edema BLE--increase Torsemide 637mdaily insetting of cough, O2 desaturation, CXR showed CHF. Update BMP and BNP     PNA (pneumonia) 01/02/14 cough and O2 desaturation-CXR 01/02/14 showed ? PNA-will complete Z-pk. Adding Avelox 40085m 2 weeks. medro dose pk. Mucinex 600m68md x 1wk. Schedule DuoNeb q6hr for 1 week then prn as prior. O2 2lpm to maintain O2 Sat >90%.   Asthma exacerbation chronic hacking cough, prn Neb is adequate    Constipation Stable. 11/04/13 changed Senokot II from qhs for hs prn.    HTN (hypertension) Controlled on Doxazosin 4mg 8m Torsemide 50mg 61mAtenolol 50mg b29mand Losartan 100mg da45m Clonidine 0.1mg prn 38mr available to her.     Parkinson disease Chin and hand tremor observed but not disabling, takes Sinemet 25/250mg tid.57mks to and from dinning room for her room with walker x3/day    Peripheral neuropathy Burning sensation in her feet--able to sleep through night 9pm-7am with x1 bathroom trip in average. Takes Gabapentin 600mg  bid.24m Type 2 diabetes mellitus with diabetic neuropathy 10/12/13 Hgb A1c 4.1 11/04/13 dc Glimepiride 2mg po qam,69mntinue CBG 2x/wk for now.    Hypothyroidism Takes Levothyroxine 100mcg M, W, 58mTSH 1.499 08/27/12. 10/12/13 TSH 2.251      Vitamin B12 deficiency 10/12/13 Hgb 11.6-off  Vitamin B12 1000mcg po dail24m  Family/ Staff Communication: observe the patient.   Goals of Care: AL  Labs/tests ordered: CXR done 01/02/14

## 2014-01-03 NOTE — Assessment & Plan Note (Signed)
BLE--increase Torsemide 60mg  daily insetting of cough, O2 desaturation, CXR showed CHF. Update BMP and BNP

## 2014-01-03 NOTE — Assessment & Plan Note (Signed)
chronic hacking cough, prn Neb is adequate

## 2014-01-03 NOTE — Assessment & Plan Note (Addendum)
01/02/14 cough and O2 desaturation-CXR 01/02/14 showed ? PNA-will complete Z-pk. Adding Avelox 400mg  x 2 weeks. medro dose pk. Mucinex 600mg  bid x 1wk. Schedule DuoNeb q6hr for 1 week then prn as prior. O2 2lpm to maintain O2 Sat >90%.

## 2014-01-03 NOTE — Assessment & Plan Note (Signed)
Burning sensation in her feet--able to sleep through night 9pm-7am with x1 bathroom trip in average. Takes Gabapentin 600mg  bid.

## 2014-01-03 NOTE — Assessment & Plan Note (Addendum)
Stable. 11/04/13 changed Senokot II from qhs for hs prn.

## 2014-01-03 NOTE — Assessment & Plan Note (Addendum)
01/02/14 CXR moderate cardiomegaly with slight elevation of the pulmonary vasculature suggests mild congestive heart failure, patchy non consolidated interstitial changes both lower lungs. The differential diagnosis includes pneumonia, interstitial edema, and areas of subsegmental atelectasis.  01/03/14 update BMP and BNP. Increase Torsemide to 60mg  daily. Complete Z-pk

## 2014-01-03 NOTE — Assessment & Plan Note (Signed)
10/12/13 Hgb 11.6-off  Vitamin B12 1063mcg po daily

## 2014-01-03 NOTE — Assessment & Plan Note (Signed)
Chin and hand tremor observed but not disabling, takes Sinemet 25/250mg  tid. Walks to and from dinning room for her room with walker x3/day

## 2014-01-03 NOTE — Assessment & Plan Note (Signed)
10/12/13 Hgb A1c 4.1 11/04/13 dc Glimepiride 2mg  po qam, continue CBG 2x/wk for now.

## 2014-01-05 LAB — BASIC METABOLIC PANEL
BUN: 46 mg/dL — AB (ref 4–21)
CREATININE: 1.8 mg/dL — AB (ref 0.5–1.1)
GLUCOSE: 113 mg/dL
POTASSIUM: 4.6 mmol/L (ref 3.4–5.3)
SODIUM: 140 mmol/L (ref 137–147)

## 2014-01-06 ENCOUNTER — Other Ambulatory Visit: Payer: Self-pay | Admitting: Nurse Practitioner

## 2014-01-06 DIAGNOSIS — E1321 Other specified diabetes mellitus with diabetic nephropathy: Secondary | ICD-10-CM

## 2014-01-06 DIAGNOSIS — I509 Heart failure, unspecified: Secondary | ICD-10-CM

## 2014-01-11 ENCOUNTER — Emergency Department (HOSPITAL_COMMUNITY): Payer: Medicare Other

## 2014-01-11 ENCOUNTER — Encounter (HOSPITAL_COMMUNITY): Payer: Self-pay | Admitting: *Deleted

## 2014-01-11 ENCOUNTER — Emergency Department (HOSPITAL_COMMUNITY)
Admission: EM | Admit: 2014-01-11 | Discharge: 2014-01-11 | Disposition: A | Discharge status: Home / Self Care | Payer: Medicare Other | Source: Home / Self Care | Attending: Emergency Medicine | Admitting: Emergency Medicine

## 2014-01-11 DIAGNOSIS — Y9301 Activity, walking, marching and hiking: Secondary | ICD-10-CM

## 2014-01-11 DIAGNOSIS — S199XXA Unspecified injury of neck, initial encounter: Secondary | ICD-10-CM | POA: Insufficient documentation

## 2014-01-11 DIAGNOSIS — W19XXXA Unspecified fall, initial encounter: Secondary | ICD-10-CM

## 2014-01-11 DIAGNOSIS — E119 Type 2 diabetes mellitus without complications: Secondary | ICD-10-CM | POA: Insufficient documentation

## 2014-01-11 DIAGNOSIS — Z66 Do not resuscitate: Secondary | ICD-10-CM | POA: Diagnosis present

## 2014-01-11 DIAGNOSIS — Z8739 Personal history of other diseases of the musculoskeletal system and connective tissue: Secondary | ICD-10-CM

## 2014-01-11 DIAGNOSIS — Z79899 Other long term (current) drug therapy: Secondary | ICD-10-CM

## 2014-01-11 DIAGNOSIS — G629 Polyneuropathy, unspecified: Secondary | ICD-10-CM

## 2014-01-11 DIAGNOSIS — D696 Thrombocytopenia, unspecified: Secondary | ICD-10-CM | POA: Diagnosis present

## 2014-01-11 DIAGNOSIS — G20 Parkinson's disease: Secondary | ICD-10-CM | POA: Diagnosis present

## 2014-01-11 DIAGNOSIS — Y998 Other external cause status: Secondary | ICD-10-CM | POA: Insufficient documentation

## 2014-01-11 DIAGNOSIS — R6520 Severe sepsis without septic shock: Secondary | ICD-10-CM | POA: Diagnosis present

## 2014-01-11 DIAGNOSIS — W01198A Fall on same level from slipping, tripping and stumbling with subsequent striking against other object, initial encounter: Secondary | ICD-10-CM

## 2014-01-11 DIAGNOSIS — A419 Sepsis, unspecified organism: Secondary | ICD-10-CM | POA: Diagnosis not present

## 2014-01-11 DIAGNOSIS — S8001XA Contusion of right knee, initial encounter: Secondary | ICD-10-CM | POA: Insufficient documentation

## 2014-01-11 DIAGNOSIS — Z515 Encounter for palliative care: Secondary | ICD-10-CM

## 2014-01-11 DIAGNOSIS — I10 Essential (primary) hypertension: Secondary | ICD-10-CM

## 2014-01-11 DIAGNOSIS — Y92128 Other place in nursing home as the place of occurrence of the external cause: Secondary | ICD-10-CM

## 2014-01-11 DIAGNOSIS — E875 Hyperkalemia: Secondary | ICD-10-CM | POA: Diagnosis present

## 2014-01-11 DIAGNOSIS — Z791 Long term (current) use of non-steroidal anti-inflammatories (NSAID): Secondary | ICD-10-CM

## 2014-01-11 DIAGNOSIS — N170 Acute kidney failure with tubular necrosis: Secondary | ICD-10-CM | POA: Diagnosis present

## 2014-01-11 DIAGNOSIS — Z856 Personal history of leukemia: Secondary | ICD-10-CM

## 2014-01-11 DIAGNOSIS — E1140 Type 2 diabetes mellitus with diabetic neuropathy, unspecified: Secondary | ICD-10-CM | POA: Diagnosis present

## 2014-01-11 DIAGNOSIS — G92 Toxic encephalopathy: Secondary | ICD-10-CM | POA: Diagnosis present

## 2014-01-11 DIAGNOSIS — R4182 Altered mental status, unspecified: Secondary | ICD-10-CM | POA: Diagnosis not present

## 2014-01-11 DIAGNOSIS — M542 Cervicalgia: Secondary | ICD-10-CM

## 2014-01-11 DIAGNOSIS — C9110 Chronic lymphocytic leukemia of B-cell type not having achieved remission: Secondary | ICD-10-CM | POA: Diagnosis present

## 2014-01-11 DIAGNOSIS — D649 Anemia, unspecified: Secondary | ICD-10-CM | POA: Diagnosis present

## 2014-01-11 LAB — BASIC METABOLIC PANEL
BUN: 67 mg/dL — AB (ref 4–21)
CREATININE: 1.7 mg/dL — AB (ref 0.5–1.1)
Glucose: 156 mg/dL
Potassium: 4.3 mmol/L (ref 3.4–5.3)
Sodium: 144 mmol/L (ref 137–147)

## 2014-01-11 MED ORDER — TRAMADOL HCL 50 MG PO TABS: 50.0000 mg | ORAL_TABLET | Freq: Four times a day (QID) | ORAL | Status: DC | PRN

## 2014-01-11 NOTE — ED Provider Notes (Signed)
CSN: [id number]     Arrival date & time 01/11/14  1834 History   First MD Initiated Contact with Patient 01/11/14 1849     Chief Complaint  Patient presents with  . Fall     (Consider location/radiation/quality/duration/timing/severity/associated sxs/prior Treatment) HPI Patient presents to the emergency department following a fall that occurred just prior to arrival.  The patient states she was walking with her walker down the hallway at her assisted living when she tripped over her walker, stumbled forward, landing on both of her knees.  The patient states that she has pain all over her entire body.  She did not hit her head or lose consciousness.  The nursing home staff reported that she did not hit her head and did not lose consciousness.  Patient denies headache, blurred vision, weakness, dizziness, lightheadedness, fever, cough, chest pain, shortness of breath or syncope.  She states that movement and palpation makes her pain worse Past Medical History  Diagnosis Date  . Diabetes mellitus without complication   . Leukemia, chronic lymphoid   . Paralysis agitans   . Neuropathy   . Hypertension   . Lumbar stenosis   . Abnormal gait   . Parkinson disease    Past Surgical History  Procedure Laterality Date  . Cholecystectomy  07/2003  . Appendectomy    . Bone marrow biopsy  08/16/2003    Dr. Jana Hakim  . Colonoscopy  2003    normal Dr. Oletta Lamas   Family History  Problem Relation Age of Onset  . Kidney disease Mother   . Heart disease Father     MI  . Heart disease Daughter    History  Substance Use Topics  . Smoking status: Never Smoker   . Smokeless tobacco: Never Used  . Alcohol Use: No   OB History    No data available     Review of Systems All other systems negative except as documented in the HPI. All pertinent positives and negatives as reviewed in the HPI.    Allergies  Codeine and Sulfa antibiotics  Home Medications   Prior to Admission medications     Medication Sig Start Date End Date Taking? Authorizing Provider  acetaminophen (TYLENOL) 650 MG CR tablet Take 650 mg by mouth daily as needed for pain.   Yes Historical Provider, MD  atenolol (TENORMIN) 50 MG tablet Take 50 mg by mouth 2 (two) times daily.     Yes Historical Provider, MD  carbidopa-levodopa (SINEMET IR) 25-250 MG per tablet Take 1 tablet by mouth 3 (three) times daily.   Yes Historical Provider, MD  Cholecalciferol (VITAMIN D) 2000 UNITS tablet Take 2,000 Units by mouth daily.   Yes Historical Provider, MD  cloNIDine (CATAPRES) 0.1 MG tablet Take 0.1 mg by mouth every 8 (eight) hours as needed. For blood pressure   Yes Historical Provider, MD  doxazosin (CARDURA) 4 MG tablet Take 4 mg by mouth daily with breakfast.    Yes Historical Provider, MD  gabapentin (NEURONTIN) 600 MG tablet Take 600 mg by mouth 2 (two) times daily.   Yes Historical Provider, MD  guaiFENesin (MUCINEX) 600 MG 12 hr tablet Take 600 mg by mouth 2 (two) times daily. For 1 week 01/04/14  Yes Historical Provider, MD  guaiFENesin-dextromethorphan (ROBITUSSIN DM) 100-10 MG/5ML syrup Take 10 mLs by mouth every 6 (six) hours as needed for cough.   Yes Historical Provider, MD  ipratropium-albuterol (DUONEB) 0.5-2.5 (3) MG/3ML SOLN Take 3 mLs by nebulization every 4 (four) hours  as needed.   Yes Historical Provider, MD  levothyroxine (SYNTHROID, LEVOTHROID) 100 MCG tablet Take 100 mcg by mouth every Monday, Wednesday, and Friday. Takes 1 tablet on mondays, wednesdays and fridays   Yes Historical Provider, MD  losartan (COZAAR) 100 MG tablet Take 100 mg by mouth at bedtime.     Yes Historical Provider, MD  meloxicam (MOBIC) 15 MG tablet Take 15 mg by mouth daily with breakfast.   Yes Historical Provider, MD  moxifloxacin (AVELOX) 400 MG tablet Take 400 mg by mouth daily at 8 pm. For 2 weeks 01/03/14  Yes Historical Provider, MD  Polyethyl Glycol-Propyl Glycol (SYSTANE) 0.4-0.3 % SOLN Place 1 drop into both eyes 4 (four)  times daily as needed (for dry eyes).    Yes Historical Provider, MD  polyethylene glycol (MIRALAX / GLYCOLAX) packet Take 17 g by mouth every Monday, Wednesday, and Friday. And also as needed   Yes Historical Provider, MD  saccharomyces boulardii (FLORASTOR) 250 MG capsule Take 250 mg by mouth 2 (two) times daily. For 2 weeks 01/03/14  Yes Historical Provider, MD  senna (SENOKOT) 8.6 MG tablet Take 2 tablets by mouth at bedtime as needed for constipation.   Yes Historical Provider, MD  torsemide (DEMADEX) 20 MG tablet Take 60 mg by mouth daily with breakfast.   Yes Historical Provider, MD   BP 144/60 mmHg  Pulse 64  Temp(Src) 97.6 F (36.4 C) (Oral)  Resp 18  SpO2 95% Physical Exam  Constitutional: She is oriented to person, place, and time. She appears well-developed and well-nourished. No distress.  HENT:  Head: Normocephalic and atraumatic.  Mouth/Throat: Oropharynx is clear and moist.  Eyes: Pupils are equal, round, and reactive to light.  Neck: Normal range of motion. Neck supple.  Cardiovascular: Normal rate, regular rhythm and normal heart sounds.  Exam reveals no gallop and no friction rub.   No murmur heard. Pulmonary/Chest: Effort normal and breath sounds normal. No respiratory distress. She exhibits tenderness.  Abdominal: Soft. Bowel sounds are normal. She exhibits no distension. There is no tenderness.  Musculoskeletal:       Right knee: She exhibits normal range of motion, no swelling, no effusion, no ecchymosis, no laceration, no erythema and no bony tenderness.       Cervical back: She exhibits tenderness. She exhibits normal range of motion, no bony tenderness and no swelling.       Legs: Neurological: She is alert and oriented to person, place, and time. She exhibits normal muscle tone. Coordination normal.  Skin: Skin is warm and dry.    ED Course  Procedures (including critical care time) Labs Review Labs Reviewed - No data to display  Imaging Review Dg Ribs  Unilateral W/chest Right  01/11/2014   CLINICAL DATA:  Pt c/o entire right side of body pain s/p fall at retirement home today, pt reports she was walking down hall and her "legs gave out" causing her to fall. Pt has pain under right breast  EXAM: RIGHT RIBS AND CHEST - 3+ VIEW  COMPARISON:  None.  FINDINGS: No fracture or other bone lesions are seen involving the ribs. There is no evidence of pneumothorax or pleural effusion. Both lungs are clear. Heart size and mediastinal contours are within normal limits.  IMPRESSION: Negative.   Electronically Signed   By: Kathreen Devoid   On: 01/11/2014 20:18   Dg Pelvis 1-2 Views  01/11/2014   CLINICAL DATA:  Right-sided body pain after fall at retirement home today  EXAM: PELVIS - 1-2 VIEW  COMPARISON:  02/16/2009  FINDINGS: Degenerative disc disease noted within the lower lumbar spine. Mild osteoarthritis involves both hip joints. No acute fracture or subluxation identified.  IMPRESSION: 1. No acute findings. 2. Chronic degenerative change.   Electronically Signed   By: Kerby Moors M.D.   On: 01/11/2014 20:20   Ct Head Wo Contrast  01/11/2014   CLINICAL DATA:  Status post fall  EXAM: CT HEAD WITHOUT CONTRAST  CT CERVICAL SPINE WITHOUT CONTRAST  TECHNIQUE: Multidetector CT imaging of the head and cervical spine was performed following the standard protocol without intravenous contrast. Multiplanar CT image reconstructions of the cervical spine were also generated.  COMPARISON:  12/19/7 and 12/21/2010  FINDINGS: CT HEAD FINDINGS  Mild low attenuation throughout the subcortical and periventricular white matter is identified compatible with chronic microvascular disease. Prominence of the sulci and ventricles identified consistent with brain atrophy. No acute infarct, hemorrhage or mass identified. The paranasal sinuses are clear. The mastoid air cells are clear. The calvarium appears intact.  CT CERVICAL SPINE FINDINGS  There is straightening of normal cervical  lordosis. The vertebral body heights are well preserved. There is a mild antral listhesis of C4 on C5. Multi level disc space narrowing and ventral endplate spurring is identified. This is most advanced at C5-6 and C6-7. Bilateral facet hypertrophy and degenerative changes noted. The prevertebral soft tissue space appears normal. Calcified atherosclerotic disease involves the carotid arteries.  IMPRESSION: 1. No acute intracranial abnormalities. 2. Chronic small vessel ischemic disease and brain atrophy. 3. Cervical spondylosis. No evidence for cervical spine fracture or dislocation. 4. Atherosclerotic disease.   Electronically Signed   By: Kerby Moors M.D.   On: 01/11/2014 20:15   Ct Cervical Spine Wo Contrast  01/11/2014   CLINICAL DATA:  Status post fall  EXAM: CT HEAD WITHOUT CONTRAST  CT CERVICAL SPINE WITHOUT CONTRAST  TECHNIQUE: Multidetector CT imaging of the head and cervical spine was performed following the standard protocol without intravenous contrast. Multiplanar CT image reconstructions of the cervical spine were also generated.  COMPARISON:  12/19/7 and 12/21/2010  FINDINGS: CT HEAD FINDINGS  Mild low attenuation throughout the subcortical and periventricular white matter is identified compatible with chronic microvascular disease. Prominence of the sulci and ventricles identified consistent with brain atrophy. No acute infarct, hemorrhage or mass identified. The paranasal sinuses are clear. The mastoid air cells are clear. The calvarium appears intact.  CT CERVICAL SPINE FINDINGS  There is straightening of normal cervical lordosis. The vertebral body heights are well preserved. There is a mild antral listhesis of C4 on C5. Multi level disc space narrowing and ventral endplate spurring is identified. This is most advanced at C5-6 and C6-7. Bilateral facet hypertrophy and degenerative changes noted. The prevertebral soft tissue space appears normal. Calcified atherosclerotic disease involves  the carotid arteries.  IMPRESSION: 1. No acute intracranial abnormalities. 2. Chronic small vessel ischemic disease and brain atrophy. 3. Cervical spondylosis. No evidence for cervical spine fracture or dislocation. 4. Atherosclerotic disease.   Electronically Signed   By: Kerby Moors M.D.   On: 01/11/2014 20:15   Dg Knee Complete 4 Views Right  01/11/2014   CLINICAL DATA:  Pt. reports she was walking down hall and her "legs gave out" causing her to fall. Pt has abrasion to right patellar region  EXAM: RIGHT KNEE - COMPLETE 4+ VIEW  COMPARISON:  01/1906  FINDINGS: Generalized osteopenia. No fracture or dislocation. Mild osteoarthritis of the lateral femorotibial compartment.  No significant joint effusion. Unremarkable soft tissues.  IMPRESSION: No acute osseous injury of the right knee.   Electronically Signed   By: Kathreen Devoid   On: 01/11/2014 20:16   Patient will be discharged home and advised to follow up with her doctor, told to return here as needed.  This is a mechanical fall based on the history of present illness.   MDM   Final diagnoses:  Sharyl Nimrod, PA-C 01/11/14 2108  Johnna Acosta, MD 01/11/14 2122

## 2014-01-11 NOTE — Discharge Instructions (Signed)
Tylenol and motrin  For pain. Return here as needed. Follow up with your doctor.

## 2014-01-11 NOTE — Progress Notes (Signed)
CSW went to speak with Pt. There was family at bedside [facility] [phone]). Pt informed CSW that she came from [facility], and has been staying there for the past 15 years. Pt says she was walking and fell. She stated she feels awful. Pt also mentioned that she was cold. CSW got Pt a blanket. The pt says the doctor will be coming back shortly for Xrays.   Willette Brace [phone] ED CSW 01/11/2014 7:20 PM

## 2014-01-11 NOTE — ED Notes (Addendum)
Per EMS, pt tripped and fell while walking with her walker. Pt denies hitting head, losing consciousness, taking blood thinners. Pt complains of 10/10 pain all over. Pt arrived in c-collar and KED.

## 2014-01-11 NOTE — ED Notes (Signed)
Bed: WA20 Expected date:  Expected time:  Means of arrival:  Comments: EMS-fall 

## 2014-01-12 ENCOUNTER — Non-Acute Institutional Stay: Payer: Medicare Other | Admitting: Nurse Practitioner

## 2014-01-12 ENCOUNTER — Encounter: Payer: Self-pay | Admitting: Nurse Practitioner

## 2014-01-12 DIAGNOSIS — I10 Essential (primary) hypertension: Secondary | ICD-10-CM

## 2014-01-12 DIAGNOSIS — K5900 Constipation, unspecified: Secondary | ICD-10-CM

## 2014-01-12 DIAGNOSIS — E039 Hypothyroidism, unspecified: Secondary | ICD-10-CM

## 2014-01-12 DIAGNOSIS — W19XXXA Unspecified fall, initial encounter: Secondary | ICD-10-CM | POA: Insufficient documentation

## 2014-01-12 DIAGNOSIS — J189 Pneumonia, unspecified organism: Secondary | ICD-10-CM

## 2014-01-12 DIAGNOSIS — E538 Deficiency of other specified B group vitamins: Secondary | ICD-10-CM

## 2014-01-12 DIAGNOSIS — R4182 Altered mental status, unspecified: Secondary | ICD-10-CM | POA: Insufficient documentation

## 2014-01-12 DIAGNOSIS — E1321 Other specified diabetes mellitus with diabetic nephropathy: Secondary | ICD-10-CM

## 2014-01-12 DIAGNOSIS — E1140 Type 2 diabetes mellitus with diabetic neuropathy, unspecified: Secondary | ICD-10-CM

## 2014-01-12 DIAGNOSIS — G629 Polyneuropathy, unspecified: Secondary | ICD-10-CM

## 2014-01-12 DIAGNOSIS — G20 Parkinson's disease: Secondary | ICD-10-CM

## 2014-01-12 DIAGNOSIS — W19XXXS Unspecified fall, sequela: Secondary | ICD-10-CM

## 2014-01-12 DIAGNOSIS — R609 Edema, unspecified: Secondary | ICD-10-CM

## 2014-01-12 DIAGNOSIS — Y92129 Unspecified place in nursing home as the place of occurrence of the external cause: Secondary | ICD-10-CM

## 2014-01-12 NOTE — Assessment & Plan Note (Signed)
01/05/14 Bun/creat 46/1.76 01/11/14 Bun/creat 67/1.69 01/12/14 will repeat BMP and BNP. Continue Torsemide 60mg  due to BLE edema and respiratory symptoms.

## 2014-01-12 NOTE — Assessment & Plan Note (Signed)
10/12/13 Hgb A1c 4.1 11/04/13 dc Glimepiride 2mg  po qam, continue CBG 2x/wk for now.

## 2014-01-12 NOTE — Assessment & Plan Note (Signed)
01/02/14 cough and O2 desaturation-CXR 01/02/14 showed ? PNA-will complete Z-pk. Adding Avelox 400mg  x 2 weeks. medro dose pk. Mucinex 600mg  bid x 1wk. Schedule DuoNeb q6hr for 1 week then prn as prior. O2 2lpm to maintain O2 Sat >90%.  01/11/14 much improved respiratory aspect except generalized weakness.

## 2014-01-12 NOTE — Assessment & Plan Note (Signed)
Lethargy and O2 desaturation when she is erected and resolved after supine and O2 [place]. She is at her baseline mentation except generalized weakness. Will continue to monitor

## 2014-01-12 NOTE — Assessment & Plan Note (Signed)
Stable. 11/04/13 changed Senokot II from qhs for hs prn.

## 2014-01-12 NOTE — Assessment & Plan Note (Signed)
Takes Levothyroxine 123mcg M, W, F,  TSH 1.499 08/27/12. 10/12/13 TSH 2.251

## 2014-01-12 NOTE — Progress Notes (Signed)
Patient ID: Vanessa Carroll, female   DOB: 1920/06/11, 80 y.o.   MRN: [id number]   Code Status: DNR  Allergies  Allergen Reactions  . Codeine Nausea And Vomiting  . Sulfa Antibiotics Swelling    Chief Complaint  Patient presents with  . Medical Management of Chronic Issues  . Acute Visit    s/p fall, AMS, BLE edema, PNA    HPI: Patient is a 80 y.o. female seen in the [place] at [facility] today for evaluation of Ed eval for fall, treated PNA, AMS 01/12/14, and other chronic medical conditions    01/11/14 ED eval for fall:  CT head, X-ray R ribs, R knee, and pelvis-unremarkable.    01/12/14 AMS when transferred from bed to chair-resolved after transferred from chair back to bed-no focal neurological deficits except lethargy and O2 desaturation for a few minutes.      Problem List Items Addressed This Visit    Vitamin B12 deficiency    10/12/13 Hgb 11.6-off  Vitamin B12 1053mg po daily      Type 2 diabetes mellitus with diabetic neuropathy (Chronic)    10/12/13 Hgb A1c 4.1 11/04/13 dc Glimepiride 28mpo qam, continue CBG 2x/wk for now.      PNA (pneumonia)    01/02/14 cough and O2 desaturation-CXR 01/02/14 showed ? PNA-will complete Z-pk. Adding Avelox 40052m 2 weeks. medro dose pk. Mucinex 600m42md x 1wk. Schedule DuoNeb q6hr for 1 week then prn as prior. O2 2lpm to maintain O2 Sat >90%.  01/11/14 much improved respiratory aspect except generalized weakness.      Peripheral neuropathy    Burning sensation in her feet--able to sleep through night 9pm-7am with x1 bathroom trip in average. Takes Gabapentin 600mg28m.       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 Continue Torsemide 60mg d73mo BLE  edema and respiratory symptoms.      Hypothyroidism (Chronic)    Takes Levothyroxine 100mcg M69m F,  TSH 1.499 08/27/12. 10/12/13 TSH 2.251      HTN (hypertension) (Chronic)    Controlled on Doxazosin 4mg qd, 36msemide 50mg qd, 58molol 50mg bid, 16mLosartan 100mg daily.58mnidine 0.1mg prn q8hr52mailable to her    Fall at nursing home    01/11/14 ED eval for fall:  CT head, X-ray R ribs, R knee, and pelvis-unremarkable.  01/12/14 no decreased ROM of limbs and no new c/o pain except generalized weakness.      Edema    BLE- Torsemide 60mg daily in72ming of cough, O2 desaturation, CXR showed CHF.    Constipation    Stable. 11/04/13 changed Senokot II from qhs for hs prn.       Altered mental status - Primary    Lethargy and O2 desaturation when she is erected and resolved after supine and O2 [place]. She is at her baseline mentation except generalized weakness. Will continue to monitor        Review of Systems:  Review of Systems  Constitutional: Positive for malaise/fatigue. Negative for fever, chills, weight loss (weight gain) and diaphoresis.  HENT: Positive for hearing loss (mild). Negative for congestion, ear discharge, ear pain, nosebleeds, sore throat and tinnitus.   Eyes: Negative  for blurred vision, double vision, photophobia, pain, discharge and redness.  Respiratory: Positive for cough (chronic), sputum production and shortness of breath. Negative for hemoptysis and stridor.        Worsened cough and yellow/greeniush sputum-improving.   Cardiovascular: Positive for leg swelling. Negative for chest pain, palpitations, orthopnea, claudication and PND.  Gastrointestinal: Positive for constipation (prn MiraLax is adequate). Negative for heartburn, nausea, vomiting, abdominal pain, diarrhea, blood in stool and melena.  Genitourinary: Positive for frequency (after she takes am Torsemide). Negative for dysuria, urgency, hematuria and flank pain.  Musculoskeletal: Positive for  joint pain. Negative for myalgias, back pain, falls and neck pain.  Skin: Negative for itching and rash.       There is chronic skin lesion anterior right lower leg--been followed by her Dermatology  Neurological: Positive for tremors and sensory change. Negative for dizziness, tingling, speech change, focal weakness, seizures, loss of consciousness, weakness (her usual state of ambulating with walker to and from her room to dinning room 3x/day) and headaches.  Endo/Heme/Allergies: Negative for environmental allergies and polydipsia. Does not bruise/bleed easily.  Psychiatric/Behavioral: Positive for depression (flat affect and negatitive) and memory loss. Negative for suicidal ideas, hallucinations and substance abuse. The patient is not nervous/anxious and does not have insomnia.      Past Medical History  Diagnosis Date  . Diabetes mellitus without complication   . Leukemia, chronic lymphoid   . Paralysis agitans   . Neuropathy   . Hypertension   . Lumbar stenosis   . Abnormal gait   . Parkinson disease    Past Surgical History  Procedure Laterality Date  . Cholecystectomy  07/2003  . Appendectomy    . Bone marrow biopsy  08/16/2003    Dr. Jana Hakim  . Colonoscopy  2003    normal Dr. Oletta Lamas   Social History:   reports that she has never smoked. She has never used smokeless tobacco. She reports that she does not drink alcohol or use illicit drugs.  Family History  Problem Relation Age of Onset  . Kidney disease Mother   . Heart disease Father     MI  . Heart disease Daughter     Medications: Patient's Medications  New Prescriptions   No medications on file  Previous Medications   ACETAMINOPHEN (TYLENOL) 650 MG CR TABLET    Take 650 mg by mouth daily as needed for pain.   ATENOLOL (TENORMIN) 50 MG TABLET    Take 50 mg by mouth 2 (two) times daily.     CARBIDOPA-LEVODOPA (SINEMET IR) 25-250 MG PER TABLET    Take 1 tablet by mouth 3 (three) times daily.   CHOLECALCIFEROL  (VITAMIN D) 2000 UNITS TABLET    Take 2,000 Units by mouth daily.   CLONIDINE (CATAPRES) 0.1 MG TABLET    Take 0.1 mg by mouth every 8 (eight) hours as needed. For blood pressure   DOXAZOSIN (CARDURA) 4 MG TABLET    Take 4 mg by mouth daily with breakfast.    GABAPENTIN (NEURONTIN) 600 MG TABLET    Take 600 mg by mouth 2 (two) times daily.   GUAIFENESIN (MUCINEX) 600 MG 12 HR TABLET    Take 600 mg by mouth 2 (two) times daily. For 1 week   GUAIFENESIN-DEXTROMETHORPHAN (ROBITUSSIN DM) 100-10 MG/5ML SYRUP    Take 10 mLs by mouth every 6 (six) hours as needed for cough.   IPRATROPIUM-ALBUTEROL (DUONEB) 0.5-2.5 (3) MG/3ML SOLN    Take 3 mLs by nebulization every 4 (  four) hours as needed.   LEVOTHYROXINE (SYNTHROID, LEVOTHROID) 100 MCG TABLET    Take 100 mcg by mouth every Monday, Wednesday, and Friday. Takes 1 tablet on mondays, wednesdays and fridays   LOSARTAN (COZAAR) 100 MG TABLET    Take 100 mg by mouth at bedtime.     MELOXICAM (MOBIC) 15 MG TABLET    Take 15 mg by mouth daily with breakfast.   MOXIFLOXACIN (AVELOX) 400 MG TABLET    Take 400 mg by mouth daily at 8 pm. For 2 weeks   POLYETHYL GLYCOL-PROPYL GLYCOL (SYSTANE) 0.4-0.3 % SOLN    Place 1 drop into both eyes 4 (four) times daily as needed (for dry eyes).    POLYETHYLENE GLYCOL (MIRALAX / GLYCOLAX) PACKET    Take 17 g by mouth every Monday, Wednesday, and Friday. And also as needed   SACCHAROMYCES BOULARDII (FLORASTOR) 250 MG CAPSULE    Take 250 mg by mouth 2 (two) times daily. For 2 weeks   SENNA (SENOKOT) 8.6 MG TABLET    Take 2 tablets by mouth at bedtime as needed for constipation.   TORSEMIDE (DEMADEX) 20 MG TABLET    Take 60 mg by mouth daily with breakfast.   TRAMADOL (ULTRAM) 50 MG TABLET    Take 1 tablet (50 mg total) by mouth every 6 (six) hours as needed.  Modified Medications   No medications on file  Discontinued Medications   No medications on file     Physical Exam:Physical Exam  Constitutional: She is oriented to  person, place, and time. She appears well-developed and well-nourished. No distress.  HENT:  Head: Normocephalic and atraumatic.  Right Ear: External ear normal.  Left Ear: External ear normal.  Nose: Nose normal.  Mouth/Throat: Oropharynx is clear and moist. No oropharyngeal exudate.  Eyes: Conjunctivae are normal. Pupils are equal, round, and reactive to light. Right eye exhibits no discharge. Left eye exhibits no discharge. No scleral icterus.  Neck: Normal range of motion. Neck supple. No JVD present. No tracheal deviation present. No thyromegaly present.  Cardiovascular: Normal rate, regular rhythm and normal heart sounds.   No murmur heard. Pulmonary/Chest: Effort normal. No stridor. No respiratory distress. She has no wheezes. She has rales. She exhibits no tenderness.  Bibasilar moist rales  Abdominal: Soft. Bowel sounds are normal. She exhibits no distension. There is no tenderness. There is no rebound and no guarding.  Musculoskeletal: Normal range of motion. She exhibits edema. She exhibits no tenderness.  Worsened edema BLE 2+  Lymphadenopathy:    She has no cervical adenopathy.  Neurological: She is alert and oriented to person, place, and time. She has normal reflexes. No cranial nerve deficit. She exhibits normal muscle tone. Coordination normal.  Tremor seen in her hands and chin.   Skin: Skin is warm and dry. No rash noted. She is not diaphoretic. No erythema. No pallor.  Anterior right shin skin lesion--Dermatology  Psychiatric: Her mood appears not anxious. Her affect is not angry, not blunt, not labile and not inappropriate. Her speech is not rapid and/or pressured, not delayed, not tangential and not slurred. She is not agitated, not aggressive, not hyperactive, not slowed, not withdrawn, not actively hallucinating and not combative. Thought content is not paranoid and not delusional. Cognition and memory are impaired. She does not express impulsivity. She exhibits a  depressed mood (flat affect). She is communicative. She exhibits abnormal recent memory. She exhibits normal remote memory.  Flat affect She is attentive.    Labs reviewed:  Basic Metabolic Panel:  Recent Labs  10/12/13 01/05/14 01/11/14  NA 144 140 144  K 4.2 4.6 4.3  BUN 33* 46* 67*  CREATININE 1.2* 1.8* 1.7*  TSH 2.25  --   --    Liver Function Tests:  Recent Labs  10/12/13  AST 12*  ALT 8  ALKPHOS 117   CBC:  Recent Labs  10/12/13  WBC 8.2  HGB 11.6*  HCT 34*  PLT 160   Lipid Panel: No results for input(s): CHOL, HDL, LDLCALC, TRIG, CHOLHDL, LDLDIRECT in the last 8760 hours. Past Procedures:  09/01/12 US Pelvis: s/p hysterectomy. Nether ovary visualized. No free intraperitoneal fluid or adnexal mass.   09/01/12 [place] abd: mild atrophy left kidney. Mild hepatomegaly. S/p cholecystectomy. Distal abdominal aorta not seen secondary to obscuring bowel gas.   10/22/12 X-ray R+L knees: no acute fracture, malalignment, or lytic destructive lesion. Small degenerative spur at the superior patella. No significant joint space narrowing.   11/23/12 CXR mild bronchitis at the mid and lower lungs bilaterally. Borderline cardiomegaly unchanged without pulmonary vascular congestion or pleural effusion. No inflammatory consolidate or suspicious nodule.   01/02/14 CXR moderate cardiomegaly with slight elevation of the pulmonary vasculature suggests mild congestive heart failure, patchy non consolidated interstitial changes both lower lungs. The differential diagnosis includes pneumonia, interstitial edema, and areas of subsegmental atelectasis.   01/11/14 X-ray R ribs  IMPRESSION: Negative  01/11/14 x-ray Pelvis  IMPRESSION: 1. No acute findings. 2. Chronic degenerative change.   01/11/14 X-ray R knee  IMPRESSION: No acute osseous injury of the right knee.   01/11/14 CT head/cervical spine:  IMPRESSION: 1. No acute intracranial abnormalities. 2. Chronic small vessel ischemic  disease and brain atrophy. 3. Cervical spondylosis. No evidence for cervical spine fracture or dislocation. 4. Atherosclerotic disease.  Assessment/Plan Fall at nursing home 01/11/14 ED eval for fall:  CT head, X-ray R ribs, R knee, and pelvis-unremarkable.  01/12/14 no decreased ROM of limbs and no new c/o pain except generalized weakness.    Altered mental status Lethargy and O2 desaturation when she is erected and resolved after supine and O2 [place]. She is at her baseline mentation except generalized weakness. Will continue to monitor   Vitamin B12 deficiency 10/12/13 Hgb 11.6-off  Vitamin B12 1092mg po daily    Type 2 diabetes mellitus with diabetic neuropathy 10/12/13 Hgb A1c 4.1 11/04/13 dc Glimepiride 245mpo qam, continue CBG 2x/wk for now.    PNA (pneumonia) 01/02/14 cough and O2 desaturation-CXR 01/02/14 showed ? PNA-will complete Z-pk. Adding Avelox 40012m 2 weeks. medro dose pk. Mucinex 600m6md x 1wk. Schedule DuoNeb q6hr for 1 week then prn as prior. O2 2lpm to maintain O2 Sat >90%.  01/11/14 much improved respiratory aspect except generalized weakness.    Constipation Stable. 11/04/13 changed Senokot II from qhs for hs prn.     Edema BLE- Torsemide 60mg78mly insetting of cough, O2 desaturation, CXR showed CHF.  HTN (hypertension) Controlled on Doxazosin 4mg q70mTorsemide 50mg q58mtenolol 50mg bi30mnd Losartan 100mg dai33mClonidine 0.1mg prn q14m available to her  Hypothyroidism Takes Levothyroxine 100mcg M, W28m  TSH 1.499 08/27/12. 10/12/13 TSH 2.251    Peripheral neuropathy Burning sensation in her feet--able to sleep through night 9pm-7am with x1 bathroom trip in average. Takes Gabapentin 600mg bid.  69markinson disease Chin and hand tremor observed but not disabling, takes Sinemet 25/250mg tid. Wa3mto and from dinning room for her room with walker x3/day  Nephropathy  due to secondary diabetes mellitus 01/05/14 Bun/creat 46/1.76 01/11/14  Bun/creat 67/1.69 01/12/14 will repeat BMP and BNP. Continue Torsemide 52m due to BLE edema and respiratory symptoms.      Family/ Staff Communication: observe the patient.   Goals of Care: AL  Labs/tests ordered: BMP and BNP

## 2014-01-12 NOTE — Assessment & Plan Note (Signed)
BLE- Torsemide 60mg  daily insetting of cough, O2 desaturation, CXR showed CHF.

## 2014-01-12 NOTE — Assessment & Plan Note (Signed)
Controlled on Doxazosin 4mg  qd, Torsemide 50mg  qd, Atenolol 50mg  bid, and Losartan 100mg  daily. Clonidine 0.1mg  prn q8hr available to her

## 2014-01-12 NOTE — Assessment & Plan Note (Signed)
Chin and hand tremor observed but not disabling, takes Sinemet 25/250mg  tid. Walks to and from dinning room for her room with walker x3/day

## 2014-01-12 NOTE — Assessment & Plan Note (Signed)
10/12/13 Hgb 11.6-off  Vitamin B12 1071mcg po daily

## 2014-01-12 NOTE — Assessment & Plan Note (Signed)
01/11/14 ED eval for fall:  CT head, X-ray R ribs, R knee, and pelvis-unremarkable.  01/12/14 no decreased ROM of limbs and no new c/o pain except generalized weakness.

## 2014-01-12 NOTE — Assessment & Plan Note (Signed)
Burning sensation in her feet--able to sleep through night 9pm-7am with x1 bathroom trip in average. Takes Gabapentin 600mg  bid.

## 2014-01-13 ENCOUNTER — Emergency Department (HOSPITAL_COMMUNITY): Payer: Medicare Other

## 2014-01-13 ENCOUNTER — Encounter (HOSPITAL_COMMUNITY): Payer: Self-pay

## 2014-01-13 ENCOUNTER — Inpatient Hospital Stay (HOSPITAL_COMMUNITY)
Admission: EM | Admit: 2014-01-13 | Discharge: 2014-01-17 | DRG: 871 | Disposition: A | Discharge status: Hospice | Payer: Medicare Other | Attending: Internal Medicine | Admitting: Internal Medicine

## 2014-01-13 DIAGNOSIS — N179 Acute kidney failure, unspecified: Secondary | ICD-10-CM

## 2014-01-13 DIAGNOSIS — Z79899 Other long term (current) drug therapy: Secondary | ICD-10-CM | POA: Diagnosis not present

## 2014-01-13 DIAGNOSIS — G20 Parkinson's disease: Secondary | ICD-10-CM | POA: Diagnosis present

## 2014-01-13 DIAGNOSIS — A419 Sepsis, unspecified organism: Principal | ICD-10-CM

## 2014-01-13 DIAGNOSIS — G92 Toxic encephalopathy: Secondary | ICD-10-CM | POA: Diagnosis present

## 2014-01-13 DIAGNOSIS — G929 Unspecified toxic encephalopathy: Secondary | ICD-10-CM | POA: Diagnosis present

## 2014-01-13 DIAGNOSIS — Z66 Do not resuscitate: Secondary | ICD-10-CM | POA: Diagnosis present

## 2014-01-13 DIAGNOSIS — C9110 Chronic lymphocytic leukemia of B-cell type not having achieved remission: Secondary | ICD-10-CM | POA: Diagnosis present

## 2014-01-13 DIAGNOSIS — E1140 Type 2 diabetes mellitus with diabetic neuropathy, unspecified: Secondary | ICD-10-CM | POA: Diagnosis present

## 2014-01-13 DIAGNOSIS — N170 Acute kidney failure with tubular necrosis: Secondary | ICD-10-CM | POA: Diagnosis present

## 2014-01-13 DIAGNOSIS — J189 Pneumonia, unspecified organism: Secondary | ICD-10-CM

## 2014-01-13 DIAGNOSIS — R109 Unspecified abdominal pain: Secondary | ICD-10-CM

## 2014-01-13 DIAGNOSIS — R6520 Severe sepsis without septic shock: Secondary | ICD-10-CM

## 2014-01-13 DIAGNOSIS — G20A1 Parkinson's disease without dyskinesia, without mention of fluctuations: Secondary | ICD-10-CM | POA: Diagnosis present

## 2014-01-13 DIAGNOSIS — R4182 Altered mental status, unspecified: Secondary | ICD-10-CM

## 2014-01-13 DIAGNOSIS — Z791 Long term (current) use of non-steroidal anti-inflammatories (NSAID): Secondary | ICD-10-CM | POA: Diagnosis not present

## 2014-01-13 DIAGNOSIS — D696 Thrombocytopenia, unspecified: Secondary | ICD-10-CM | POA: Diagnosis present

## 2014-01-13 DIAGNOSIS — R609 Edema, unspecified: Secondary | ICD-10-CM | POA: Diagnosis present

## 2014-01-13 DIAGNOSIS — Z515 Encounter for palliative care: Secondary | ICD-10-CM | POA: Diagnosis not present

## 2014-01-13 DIAGNOSIS — I10 Essential (primary) hypertension: Secondary | ICD-10-CM | POA: Diagnosis present

## 2014-01-13 DIAGNOSIS — D599 Acquired hemolytic anemia, unspecified: Secondary | ICD-10-CM

## 2014-01-13 DIAGNOSIS — D649 Anemia, unspecified: Secondary | ICD-10-CM | POA: Diagnosis present

## 2014-01-13 DIAGNOSIS — E875 Hyperkalemia: Secondary | ICD-10-CM | POA: Diagnosis present

## 2014-01-13 DIAGNOSIS — D72829 Elevated white blood cell count, unspecified: Secondary | ICD-10-CM | POA: Insufficient documentation

## 2014-01-13 LAB — CBC
HCT: 18.1 % — ABNORMAL LOW (ref 36.0–46.0)
Hemoglobin: 5.8 g/dL — CL (ref 12.0–15.0)
MCH: 34.5 pg — AB (ref 26.0–34.0)
MCHC: 32 g/dL (ref 30.0–36.0)
MCV: 107.7 fL — AB (ref 78.0–100.0)
PLATELETS: 112 10*3/uL — AB (ref 150–400)
RBC: 1.68 MIL/uL — ABNORMAL LOW (ref 3.87–5.11)
RDW: 21.8 % — ABNORMAL HIGH (ref 11.5–15.5)
WBC: 27.6 10*3/uL — ABNORMAL HIGH (ref 4.0–10.5)

## 2014-01-13 LAB — COMPREHENSIVE METABOLIC PANEL
ALBUMIN: 3.1 g/dL — AB (ref 3.5–5.2)
ALT: <5 U/L (ref 0–35)
AST: 15 U/L (ref 0–37)
Alkaline Phosphatase: 120 U/L — ABNORMAL HIGH (ref 39–117)
Anion gap: 9 (ref 5–15)
BUN: 84 mg/dL — ABNORMAL HIGH (ref 6–23)
CO2: 31 meq/L (ref 19–32)
CREATININE: 2.11 mg/dL — AB (ref 0.50–1.10)
Calcium: 8.2 mg/dL — ABNORMAL LOW (ref 8.4–10.5)
Chloride: 102 mEq/L (ref 96–112)
GFR calc Af Amer: 22 mL/min — ABNORMAL LOW (ref >90)
GFR, EST NON AFRICAN AMERICAN: 19 mL/min — AB (ref >90)
Glucose, Bld: 163 mg/dL — ABNORMAL HIGH (ref 70–99)
Potassium: 5.6 mEq/L — ABNORMAL HIGH (ref 3.7–5.3)
Sodium: 142 mEq/L (ref 137–147)
Total Bilirubin: 2.5 mg/dL — ABNORMAL HIGH (ref 0.3–1.2)
Total Protein: 5 g/dL — ABNORMAL LOW (ref 6.0–8.3)

## 2014-01-13 LAB — I-STAT TROPONIN, ED: Troponin i, poc: 0 ng/mL (ref 0.00–0.08)

## 2014-01-13 LAB — POC OCCULT BLOOD, ED: Fecal Occult Bld: NEGATIVE

## 2014-01-13 LAB — URINALYSIS, ROUTINE W REFLEX MICROSCOPIC
Bilirubin Urine: NEGATIVE
Glucose, UA: NEGATIVE mg/dL
Hgb urine dipstick: NEGATIVE
Ketones, ur: NEGATIVE mg/dL
LEUKOCYTES UA: NEGATIVE
Nitrite: NEGATIVE
PROTEIN: NEGATIVE mg/dL
Specific Gravity, Urine: 1.015 (ref 1.005–1.030)
Urobilinogen, UA: 0.2 mg/dL (ref 0.0–1.0)
pH: 5 (ref 5.0–8.0)

## 2014-01-13 LAB — CBG MONITORING, ED
GLUCOSE-CAPILLARY: 179 mg/dL — AB (ref 70–99)
GLUCOSE-CAPILLARY: 170 mg/dL — AB (ref 70–99)

## 2014-01-13 LAB — CBC AND DIFFERENTIAL
HCT: 20 % — AB (ref 36–46)
Hemoglobin: 6.5 g/dL — AB (ref 12.0–16.0)
Platelets: 132 10*3/uL — AB (ref 150–399)
WBC: 32.1 10^3/mL

## 2014-01-13 LAB — BASIC METABOLIC PANEL
BUN: 83 mg/dL — AB (ref 4–21)
CREATININE: 2 mg/dL — AB (ref 0.5–1.1)
Glucose: 189 mg/dL
POTASSIUM: 4.9 mmol/L (ref 3.4–5.3)
SODIUM: 138 mmol/L (ref 137–147)

## 2014-01-13 LAB — APTT: APTT: 27 s (ref 24–37)

## 2014-01-13 LAB — PROTIME-INR
INR: 1.28 (ref 0.00–1.49)
Prothrombin Time: 16.1 seconds — ABNORMAL HIGH (ref 11.6–15.2)

## 2014-01-13 LAB — ABO/RH: ABO/RH(D): O NEG

## 2014-01-13 LAB — PRO B NATRIURETIC PEPTIDE: PRO B NATRI PEPTIDE: 1828 pg/mL — AB (ref 0–450)

## 2014-01-13 LAB — I-STAT CG4 LACTIC ACID, ED: Lactic Acid, Venous: 1.29 mmol/L (ref 0.5–2.2)

## 2014-01-13 LAB — PREPARE RBC (CROSSMATCH)

## 2014-01-13 MED ORDER — PIPERACILLIN-TAZOBACTAM 3.375 G IVPB 30 MIN
3.3750 g | Freq: Once | INTRAVENOUS | Status: AC
  Administered 2014-01-13: 3.375 g via INTRAVENOUS
  Filled 2014-01-13: qty 50

## 2014-01-13 MED ORDER — CARBIDOPA-LEVODOPA 25-250 MG PO TABS
1.0000 | ORAL_TABLET | Freq: Three times a day (TID) | ORAL | Status: DC
  Filled 2014-01-13 (×3): qty 1

## 2014-01-13 MED ORDER — LEVOTHYROXINE SODIUM 100 MCG PO TABS
100.0000 ug | ORAL_TABLET | ORAL | Status: DC
  Filled 2014-01-13: qty 1

## 2014-01-13 MED ORDER — SACCHAROMYCES BOULARDII 250 MG PO CAPS
250.0000 mg | ORAL_CAPSULE | Freq: Two times a day (BID) | ORAL | Status: DC
  Filled 2014-01-13 (×2): qty 1

## 2014-01-13 MED ORDER — INSULIN ASPART 100 UNIT/ML ~~LOC~~ SOLN
0.0000 [IU] | SUBCUTANEOUS | Status: DC
  Administered 2014-01-14: 2 [IU] via SUBCUTANEOUS
  Administered 2014-01-14 (×2): 1 [IU] via SUBCUTANEOUS
  Filled 2014-01-13: qty 1

## 2014-01-13 MED ORDER — VANCOMYCIN HCL 10 G IV SOLR
1250.0000 mg | Freq: Once | INTRAVENOUS | Status: AC
  Administered 2014-01-13: 1250 mg via INTRAVENOUS
  Filled 2014-01-13: qty 1250

## 2014-01-13 MED ORDER — GUAIFENESIN ER 600 MG PO TB12
600.0000 mg | ORAL_TABLET | Freq: Two times a day (BID) | ORAL | Status: DC
  Filled 2014-01-13 (×2): qty 1

## 2014-01-13 MED ORDER — SODIUM CHLORIDE 0.9 % IV SOLN
Freq: Once | INTRAVENOUS | Status: AC
  Administered 2014-01-13: 20:00:00 via INTRAVENOUS

## 2014-01-13 MED ORDER — SODIUM CHLORIDE 0.9 % IJ SOLN: 3.0000 mL | Freq: Two times a day (BID) | INTRAMUSCULAR | Status: DC

## 2014-01-13 MED ORDER — SODIUM CHLORIDE 0.9 % IV SOLN
INTRAVENOUS | Status: DC
  Administered 2014-01-13: 23:00:00 via INTRAVENOUS

## 2014-01-13 MED ORDER — VANCOMYCIN HCL IN DEXTROSE 1-5 GM/200ML-% IV SOLN
1000.0000 mg | INTRAVENOUS | Status: DC
  Filled 2014-01-13: qty 200

## 2014-01-13 MED ORDER — GABAPENTIN 600 MG PO TABS
600.0000 mg | ORAL_TABLET | Freq: Two times a day (BID) | ORAL | Status: DC
  Filled 2014-01-13 (×2): qty 1

## 2014-01-13 MED ORDER — IPRATROPIUM-ALBUTEROL 0.5-2.5 (3) MG/3ML IN SOLN: 3.0000 mL | RESPIRATORY_TRACT | Status: DC | PRN

## 2014-01-13 MED ORDER — POLYETHYLENE GLYCOL 3350 17 G PO PACK
17.0000 g | PACK | ORAL | Status: DC
  Filled 2014-01-13: qty 1

## 2014-01-13 MED ORDER — ATENOLOL 50 MG PO TABS
50.0000 mg | ORAL_TABLET | Freq: Two times a day (BID) | ORAL | Status: DC
  Filled 2014-01-13 (×2): qty 1

## 2014-01-13 MED ORDER — PIPERACILLIN-TAZOBACTAM 3.375 G IVPB
3.3750 g | Freq: Three times a day (TID) | INTRAVENOUS | Status: DC
  Administered 2014-01-14: 3.375 g via INTRAVENOUS
  Filled 2014-01-13 (×4): qty 50

## 2014-01-13 NOTE — H&P (Signed)
[facility] History and Physical  Vanessa Carroll NWG:[id number] DOB: [birth year] DOA: 01/13/2014  Referring physician: EDP PCP: Estill Dooms, MD   Chief Complaint: AMS   HPI: Vanessa Carroll is a 80 y.o. female who has been being treated at her NH for PNA, presents to ED with worsening mental status over the past day.  2 days ago she was seen at [facility] ED for a fall (CT head negative at that time), today she was not responsive verbally which is a new and major change for her.  As a result son had her brought to ED.  Review of Systems: unable to perform due to AMS.  Past Medical History  Diagnosis Date  . Diabetes mellitus without complication   . Leukemia, chronic lymphoid   . Paralysis agitans   . Neuropathy   . Hypertension   . Lumbar stenosis   . Abnormal gait   . Parkinson disease    Past Surgical History  Procedure Laterality Date  . Cholecystectomy  07/2003  . Appendectomy    . Bone marrow biopsy  08/16/2003    Dr. Jana Hakim  . Colonoscopy  2003    normal Dr. Oletta Lamas   Social History:  reports that she has never smoked. She has never used smokeless tobacco. She reports that she does not drink alcohol or use illicit drugs.  Allergies  Allergen Reactions  . Codeine Nausea And Vomiting  . Sulfa Antibiotics Swelling    Family History  Problem Relation Age of Onset  . Kidney disease Mother   . Heart disease Father     MI  . Heart disease Daughter      Prior to Admission medications   Medication Sig Start Date End Date Taking? Authorizing Provider  acetaminophen (TYLENOL) 650 MG CR tablet Take 650 mg by mouth daily as needed for pain.   Yes Historical Provider, MD  atenolol (TENORMIN) 50 MG tablet Take 50 mg by mouth 2 (two) times daily.     Yes Historical Provider, MD  carbidopa-levodopa (SINEMET IR) 25-250 MG per tablet Take 1 tablet by mouth 3 (three) times daily.   Yes Historical Provider, MD  Cholecalciferol (VITAMIN D) 2000 UNITS tablet Take 2,000 Units by  mouth daily.   Yes Historical Provider, MD  cloNIDine (CATAPRES) 0.1 MG tablet Take 0.1 mg by mouth every 8 (eight) hours as needed. For blood pressure   Yes Historical Provider, MD  doxazosin (CARDURA) 4 MG tablet Take 4 mg by mouth daily with breakfast.    Yes Historical Provider, MD  gabapentin (NEURONTIN) 600 MG tablet Take 600 mg by mouth 2 (two) times daily.   Yes Historical Provider, MD  guaiFENesin (MUCINEX) 600 MG 12 hr tablet Take 600 mg by mouth 2 (two) times daily. For 1 week 01/04/14  Yes Historical Provider, MD  guaiFENesin-dextromethorphan (ROBITUSSIN DM) 100-10 MG/5ML syrup Take 10 mLs by mouth every 6 (six) hours as needed for cough.   Yes Historical Provider, MD  ipratropium-albuterol (DUONEB) 0.5-2.5 (3) MG/3ML SOLN Take 3 mLs by nebulization every 4 (four) hours as needed.   Yes Historical Provider, MD  levothyroxine (SYNTHROID, LEVOTHROID) 100 MCG tablet Take 100 mcg by mouth every Monday, Wednesday, and Friday. Takes 1 tablet on mondays, wednesdays and fridays   Yes Historical Provider, MD  losartan (COZAAR) 100 MG tablet Take 100 mg by mouth at bedtime.     Yes Historical Provider, MD  meloxicam (MOBIC) 15 MG tablet Take 15 mg by mouth daily with breakfast.  Yes Historical Provider, MD  moxifloxacin (AVELOX) 400 MG tablet Take 400 mg by mouth daily at 8 pm. For 2 weeks 01/03/14  Yes Historical Provider, MD  Polyethyl Glycol-Propyl Glycol (SYSTANE) 0.4-0.3 % SOLN Place 1 drop into both eyes 4 (four) times daily as needed (for dry eyes).    Yes Historical Provider, MD  polyethylene glycol (MIRALAX / GLYCOLAX) packet Take 17 g by mouth every Monday, Wednesday, and Friday. And also as needed   Yes Historical Provider, MD  saccharomyces boulardii (FLORASTOR) 250 MG capsule Take 250 mg by mouth 2 (two) times daily. For 2 weeks 01/03/14  Yes Historical Provider, MD  senna (SENOKOT) 8.6 MG tablet Take 2 tablets by mouth at bedtime as needed for constipation.   Yes Historical Provider,  MD  torsemide (DEMADEX) 20 MG tablet Take 60 mg by mouth daily.   Yes Historical Provider, MD  traMADol (ULTRAM) 50 MG tablet Take 1 tablet (50 mg total) by mouth every 6 (six) hours as needed. 01/11/14  Yes Brent General, PA-C   Physical Exam: Filed Vitals:   01/13/14 2146  BP:   Pulse:   Temp: 98.4 F (36.9 C)  Resp:     BP 106/43 mmHg  Pulse 64  Temp(Src) 98.4 F (36.9 C) (Oral)  Resp 19  Ht 5' 5"  (1.651 m)  Wt 90.719 kg (200 lb)  BMI 33.28 kg/m2  SpO2 100%  General Appearance:    Toxic appearing, spontaneous eye opening, not verbal appears stated age  Head:    Normocephalic, atraumatic  Eyes:    PERRL, EOMI, sclera non-icteric        Nose:   Nares without drainage or epistaxis. Mucosa, turbinates normal  Throat:   Dry mucous membranes. Oropharynx without erythema or exudate.  Neck:   Supple. No carotid bruits.  No thyromegaly.  No lymphadenopathy.   Back:     No CVA tenderness, no spinal tenderness  Lungs:     Coarse breath sounds with productive sounding cough, diminished in L base, scattered rhonchi.  Chest wall:    No tenderness to palpitation  Heart:    3/6 SEM on left sternal border  Abdomen:     Soft, mild generalized tenderness without rebound or guarding, difficult to evaluate due to AMS nondistended, normal bowel sounds, no organomegaly  Genitalia:    deferred  Rectal:    deferred  Extremities:   No clubbing, cyanosis or edema.  Pulses:   2+ and symmetric all extremities  Skin:   Skin color, texture, turgor normal, no rashes or lesions  Lymph nodes:   Cervical, supraclavicular, and axillary nodes normal       Labs on Admission:  Basic Metabolic Panel:  Recent Labs Lab 01/11/14 01/13/14 1753  NA 144 142  K 4.3 5.6*  CL  --  102  CO2  --  31  GLUCOSE  --  163*  BUN 67* 84*  CREATININE 1.7* 2.11*  CALCIUM  --  8.2*   Liver Function Tests:  Recent Labs Lab 01/13/14 1753  AST 15  ALT <5  ALKPHOS 120*  BILITOT 2.5*  PROT 5.0*   ALBUMIN 3.1*   No results for input(s): LIPASE, AMYLASE in the last 168 hours. No results for input(s): AMMONIA in the last 168 hours. CBC:  Recent Labs Lab 01/13/14 1753  WBC 27.6*  HGB 5.8*  HCT 18.1*  MCV 107.7*  PLT 112*   Cardiac Enzymes: No results for input(s): CKTOTAL, CKMB, CKMBINDEX, TROPONINI in the  last 168 hours.  BNP (last 3 results)  Recent Labs  01/13/14 1851  PROBNP 1828.0*   CBG:  Recent Labs Lab 01/13/14 1808  GLUCAP 179*    Radiological Exams on Admission: Ct Abdomen Pelvis Wo Contrast  01/13/2014   CLINICAL DATA:  80 year old female with worsening renal function and generalized abdominal pain. Evaluate for abdominal aortic aneurysm or signs of occult bleeding.  EXAM: CT ABDOMEN AND PELVIS WITHOUT CONTRAST  TECHNIQUE: Multidetector CT imaging of the abdomen and pelvis was performed following the standard protocol without IV contrast.  COMPARISON:  No priors.  FINDINGS: Lower chest: Trace right pleural effusion. Subsegmental atelectasis in lung bases bilaterally.  Hepatobiliary: Status post cholecystectomy. The liver has a shrunken appearance and nodular contour, suggestive of underlying cirrhosis. No suspicious appearing hepatic lesions are noted on today's non contrast CT examination. There are some calcifications along the posterior aspect of the right lobe of the liver, presumably from prior trauma.  Pancreas: Unremarkable.  Spleen: The spleen is enlarged measuring 14.9 x 4.5 x 14 cm (estimated splenic volume of 469 mL). In addition, in the superior aspect of the spleen there is a 3.4 x 2.8 x 2.6 cm intermediate attenuation (40 HU) lesion that is incompletely characterized.  Adrenals/Urinary Tract: The unenhanced appearance of the kidneys and adrenal glands is normal bilaterally. No hydroureteronephrosis. Urinary bladder is normal in appearance.  Stomach/Bowel: The unenhanced appearance of the stomach is unremarkable. No pathologic dilatation of small  bowel or colon.  Vascular/Lymphatic: Extensive atherosclerosis throughout the abdominal and pelvic vasculature, without evidence of aneurysm. No lymphadenopathy noted in the abdomen or pelvis on today's non contrast CT examination.  Reproductive: Status post hysterectomy.  Other: Small volume of ascites tracking down into the pelvis field left paracolic gutter. No pneumoperitoneum. No high attenuation intraperitoneal or retroperitoneal fluid collection to suggest a cold hemorrhage.  Musculoskeletal: There are no aggressive appearing lytic or blastic lesions noted in the visualized portions of the skeleton.  IMPRESSION: 1. No signs of occult hemorrhage in the abdomen or pelvis. 2. No evidence of abdominal aortic aneurysm. There is extensive atherosclerosis throughout the abdominal and pelvic vasculature. 3. Morphologic changes in the liver compatible with cirrhosis. Associated with this is a trace volume of ascites. There is also splenomegaly, which could be related to portal hypertension. 4. In addition, in the superior aspect of the spleen there is a 3.4 x 2.8 x 2.6 cm intermediate attenuation lesion which is incompletely characterized, but similar in size in retrospect compared to prior study 02/16/2009, likely a hemangioma. 5. Additional incidental findings, as above.   Electronically Signed   By: Vinnie Langton M.D.   On: 01/13/2014 20:23   Dg Chest Port 1 View  01/13/2014   CLINICAL DATA:  Altered mental status  EXAM: PORTABLE CHEST - 1 VIEW  COMPARISON:  January 11, 2014  FINDINGS: There is atelectatic change in the left base. There is no edema or consolidation. Heart is mildly enlarged with pulmonary vascularity within normal limits. There is atherosclerotic change in the aorta. No pneumothorax. No adenopathy. There is arthropathy in the left shoulder.  IMPRESSION: No edema or consolidation. Atelectatic change left base. Stable cardiac enlargement.   Electronically Signed   By: Lowella Grip M.D.    On: 01/13/2014 20:57    EKG: Independently reviewed.  Assessment/Plan Principal Problem:   Severe sepsis with acute organ dysfunction Active Problems:   Type 2 diabetes mellitus with diabetic neuropathy   Parkinson disease   HTN (hypertension)  AKI (acute kidney injury)   Anemia   Thrombocytopenia   1. Severe sepsis with acute organ dysfunction 1. IVF - getting 2 units PRBC in ED, after this switch to NS unless fluid overloaded 2. DIC panel given what appears to be a hemolytic anemia and mild thrombocytopenia 1. Smear negative for schistocytes per lab report 3. Zosyn and vanc empirically, patient has known PNA that has failed moxi, may or may not also have SBP given abd exam. 4. BCx pending 5. Tele monitor 6. Continuous pulse ox 2. Anemia 1. Hemoccult negative 2. Suspect hemolytic given elevation in bilirubin compared to baseline 3. Parkinson's 1. Continue sinamet 4. HTN - borderline low BPs today in ED, holding all BP meds 5. AKI - likely ATN due to #1 above (alternatively HUS) 6. DM2 - low dose SSI q4h 7. Hyperkalemia - mild at 5.6, recheck with BMP in AM.    Code Status: DNR/DNI  Family Communication: Spoke with son on phone and informed him how sick she was, re-confirmed no life support measures, he is on his way in to see her. Disposition Plan: Admit to SDU   Time spent: 70 min  Nasim Habeeb M. [facility] Pager [phone]  If 7AM-7PM, please contact the day team taking care of the patient [email or web address] Password [email or web address] 01/13/2014, 10:07 PM

## 2014-01-13 NOTE — ED Provider Notes (Signed)
CSN: [id number]     Arrival date & time 01/13/14  1730 History   First MD Initiated Contact with Patient 01/13/14 1745     Chief Complaint  Patient presents with  . Altered Mental Status     (Consider location/radiation/quality/duration/timing/severity/associated sxs/prior Treatment) HPI Comments: Patient with history of Parkinson's disease, fall 2 days ago evaluated in emergency department with normal imaging, currently on 2 week course of Levaquin for possible pneumonia -- presents with altered mental status. History provided by son at bedside as well as medical records. Patient is normally verbally responsive. She has been more withdrawn and weak over the past 1-2 weeks with congestion and cough. Patient had a fall as above. She is not on any anticoagulation. Today the patient was not verbally responsive. She is not able to voice any complaints. Level V caveat due to altered mental status.  No history of congestive heart failure. Last echo was over 10 years ago.  The history is provided by a relative and medical records.    Past Medical History  Diagnosis Date  . Diabetes mellitus without complication   . Leukemia, chronic lymphoid   . Paralysis agitans   . Neuropathy   . Hypertension   . Lumbar stenosis   . Abnormal gait   . Parkinson disease    Past Surgical History  Procedure Laterality Date  . Cholecystectomy  07/2003  . Appendectomy    . Bone marrow biopsy  08/16/2003    Dr. Jana Hakim  . Colonoscopy  2003    normal Dr. Oletta Lamas   Family History  Problem Relation Age of Onset  . Kidney disease Mother   . Heart disease Father     MI  . Heart disease Daughter    History  Substance Use Topics  . Smoking status: Never Smoker   . Smokeless tobacco: Never Used  . Alcohol Use: No   OB History    No data available     Review of Systems  Unable to perform ROS: Mental status change      Allergies  Codeine and Sulfa antibiotics  Home Medications   Prior to  Admission medications   Medication Sig Start Date End Date Taking? Authorizing Provider  acetaminophen (TYLENOL) 650 MG CR tablet Take 650 mg by mouth daily as needed for pain.    Historical Provider, MD  atenolol (TENORMIN) 50 MG tablet Take 50 mg by mouth 2 (two) times daily.      Historical Provider, MD  carbidopa-levodopa (SINEMET IR) 25-250 MG per tablet Take 1 tablet by mouth 3 (three) times daily.    Historical Provider, MD  Cholecalciferol (VITAMIN D) 2000 UNITS tablet Take 2,000 Units by mouth daily.    Historical Provider, MD  cloNIDine (CATAPRES) 0.1 MG tablet Take 0.1 mg by mouth every 8 (eight) hours as needed. For blood pressure    Historical Provider, MD  doxazosin (CARDURA) 4 MG tablet Take 4 mg by mouth daily with breakfast.     Historical Provider, MD  gabapentin (NEURONTIN) 600 MG tablet Take 600 mg by mouth 2 (two) times daily.    Historical Provider, MD  guaiFENesin (MUCINEX) 600 MG 12 hr tablet Take 600 mg by mouth 2 (two) times daily. For 1 week 01/04/14   Historical Provider, MD  guaiFENesin-dextromethorphan (ROBITUSSIN DM) 100-10 MG/5ML syrup Take 10 mLs by mouth every 6 (six) hours as needed for cough.    Historical Provider, MD  ipratropium-albuterol (DUONEB) 0.5-2.5 (3) MG/3ML SOLN Take 3 mLs by nebulization  every 4 (four) hours as needed.    Historical Provider, MD  levothyroxine (SYNTHROID, LEVOTHROID) 100 MCG tablet Take 100 mcg by mouth every Monday, Wednesday, and Friday. Takes 1 tablet on mondays, wednesdays and fridays    Historical Provider, MD  losartan (COZAAR) 100 MG tablet Take 100 mg by mouth at bedtime.      Historical Provider, MD  meloxicam (MOBIC) 15 MG tablet Take 15 mg by mouth daily with breakfast.    Historical Provider, MD  moxifloxacin (AVELOX) 400 MG tablet Take 400 mg by mouth daily at 8 pm. For 2 weeks 01/03/14   Historical Provider, MD  Polyethyl Glycol-Propyl Glycol (SYSTANE) 0.4-0.3 % SOLN Place 1 drop into both eyes 4 (four) times daily as  needed (for dry eyes).     Historical Provider, MD  polyethylene glycol (MIRALAX / GLYCOLAX) packet Take 17 g by mouth every Monday, Wednesday, and Friday. And also as needed    Historical Provider, MD  saccharomyces boulardii (FLORASTOR) 250 MG capsule Take 250 mg by mouth 2 (two) times daily. For 2 weeks 01/03/14   Historical Provider, MD  senna (SENOKOT) 8.6 MG tablet Take 2 tablets by mouth at bedtime as needed for constipation.    Historical Provider, MD  torsemide (DEMADEX) 20 MG tablet Take 60 mg by mouth daily with breakfast.    Historical Provider, MD  traMADol (ULTRAM) 50 MG tablet Take 1 tablet (50 mg total) by mouth every 6 (six) hours as needed. 01/11/14   Resa Miner Lawyer, PA-C   BP 99/63 mmHg  Pulse 66  Temp(Src) 99.3 F (37.4 C) (Rectal)  Resp 23  Ht 5' 5"  (1.651 m)  Wt 200 lb (90.719 kg)  BMI 33.28 kg/m2  SpO2 99%   Physical Exam  Constitutional: She appears well-developed and well-nourished.  HENT:  Head: Normocephalic and atraumatic.  Right Ear: External ear normal.  Left Ear: External ear normal.  Nose: Nose normal.  Dry mucous membranes.  Eyes: Conjunctivae are normal. Pupils are equal, round, and reactive to light. Right eye exhibits no discharge. Left eye exhibits no discharge.  Neck: Normal range of motion. Neck supple. No JVD present. No tracheal deviation present.  Cardiovascular: Normal rate and regular rhythm.   Murmur (3-6 systolic murmur along left sternal border) heard. Pulmonary/Chest: Effort normal. No respiratory distress. She has no wheezes. She has rhonchi (Scattered, all fields). She has no rales.  Abdominal: Soft. There is tenderness. There is no rebound and no guarding.  Difficult to evaluate, mild to moderate generalized tenderness  Genitourinary: Guaiac negative stool.  Stool was not grossly bloody.  Musculoskeletal: She exhibits no edema or tenderness.  No lower extremity cellulitis. There is 1+ pitting edema bilaterally to mid ankles.   Lymphadenopathy:    She has no cervical adenopathy.  Neurological: She is alert.  Skin: Skin is warm and dry. No rash noted. No erythema. There is pallor.  Psychiatric: She has a normal mood and affect.  Nursing note and vitals reviewed.   ED Course  Procedures (including critical care time) Labs Review Labs Reviewed  CBC - Abnormal; Notable for the following:    WBC 27.6 (*)    RBC 1.68 (*)    Hemoglobin 5.8 (*)    HCT 18.1 (*)    MCV 107.7 (*)    MCH 34.5 (*)    RDW 21.8 (*)    Platelets 112 (*)    All other components within normal limits  COMPREHENSIVE METABOLIC PANEL - Abnormal; Notable for  the following:    Potassium 5.6 (*)    Glucose, Bld 163 (*)    BUN 84 (*)    Creatinine, Ser 2.11 (*)    Calcium 8.2 (*)    Total Protein 5.0 (*)    Albumin 3.1 (*)    Alkaline Phosphatase 120 (*)    Total Bilirubin 2.5 (*)    GFR calc non Af Amer 19 (*)    GFR calc Af Amer 22 (*)    All other components within normal limits  URINALYSIS, ROUTINE W REFLEX MICROSCOPIC - Abnormal; Notable for the following:    Color, Urine AMBER (*)    All other components within normal limits  PRO B NATRIURETIC PEPTIDE - Abnormal; Notable for the following:    Pro B Natriuretic peptide (BNP) 1828.0 (*)    All other components within normal limits  PROTIME-INR - Abnormal; Notable for the following:    Prothrombin Time 16.1 (*)    All other components within normal limits  CBG MONITORING, ED - Abnormal; Notable for the following:    Glucose-Capillary 179 (*)    All other components within normal limits  CULTURE, BLOOD (ROUTINE X 2)  CULTURE, BLOOD (ROUTINE X 2)  URINE CULTURE  APTT  TECHNOLOGIST SMEAR REVIEW  DIC (DISSEMINATED INTRAVASCULAR COAGULATION) PANEL  I-STAT CG4 LACTIC ACID, ED  POC OCCULT BLOOD, ED  I-STAT TROPOININ, ED  I-STAT CG4 LACTIC ACID, ED  TYPE AND SCREEN  PREPARE RBC (CROSSMATCH)  ABO/RH    Imaging Review Ct Abdomen Pelvis Wo Contrast  01/13/2014   CLINICAL  DATA:  80 year old female with worsening renal function and generalized abdominal pain. Evaluate for abdominal aortic aneurysm or signs of occult bleeding.  EXAM: CT ABDOMEN AND PELVIS WITHOUT CONTRAST  TECHNIQUE: Multidetector CT imaging of the abdomen and pelvis was performed following the standard protocol without IV contrast.  COMPARISON:  No priors.  FINDINGS: Lower chest: Trace right pleural effusion. Subsegmental atelectasis in lung bases bilaterally.  Hepatobiliary: Status post cholecystectomy. The liver has a shrunken appearance and nodular contour, suggestive of underlying cirrhosis. No suspicious appearing hepatic lesions are noted on today's non contrast CT examination. There are some calcifications along the posterior aspect of the right lobe of the liver, presumably from prior trauma.  Pancreas: Unremarkable.  Spleen: The spleen is enlarged measuring 14.9 x 4.5 x 14 cm (estimated splenic volume of 469 mL). In addition, in the superior aspect of the spleen there is a 3.4 x 2.8 x 2.6 cm intermediate attenuation (40 HU) lesion that is incompletely characterized.  Adrenals/Urinary Tract: The unenhanced appearance of the kidneys and adrenal glands is normal bilaterally. No hydroureteronephrosis. Urinary bladder is normal in appearance.  Stomach/Bowel: The unenhanced appearance of the stomach is unremarkable. No pathologic dilatation of small bowel or colon.  Vascular/Lymphatic: Extensive atherosclerosis throughout the abdominal and pelvic vasculature, without evidence of aneurysm. No lymphadenopathy noted in the abdomen or pelvis on today's non contrast CT examination.  Reproductive: Status post hysterectomy.  Other: Small volume of ascites tracking down into the pelvis field left paracolic gutter. No pneumoperitoneum. No high attenuation intraperitoneal or retroperitoneal fluid collection to suggest a cold hemorrhage.  Musculoskeletal: There are no aggressive appearing lytic or blastic lesions noted in the  visualized portions of the skeleton.  IMPRESSION: 1. No signs of occult hemorrhage in the abdomen or pelvis. 2. No evidence of abdominal aortic aneurysm. There is extensive atherosclerosis throughout the abdominal and pelvic vasculature. 3. Morphologic changes in the liver compatible with cirrhosis.  Associated with this is a trace volume of ascites. There is also splenomegaly, which could be related to portal hypertension. 4. In addition, in the superior aspect of the spleen there is a 3.4 x 2.8 x 2.6 cm intermediate attenuation lesion which is incompletely characterized, but similar in size in retrospect compared to prior study 02/16/2009, likely a hemangioma. 5. Additional incidental findings, as above.   Electronically Signed   By: Vinnie Langton M.D.   On: 01/13/2014 20:23   Dg Chest Port 1 View  01/13/2014   CLINICAL DATA:  Altered mental status  EXAM: PORTABLE CHEST - 1 VIEW  COMPARISON:  January 11, 2014  FINDINGS: There is atelectatic change in the left base. There is no edema or consolidation. Heart is mildly enlarged with pulmonary vascularity within normal limits. There is atherosclerotic change in the aorta. No pneumothorax. No adenopathy. There is arthropathy in the left shoulder.  IMPRESSION: No edema or consolidation. Atelectatic change left base. Stable cardiac enlargement.   Electronically Signed   By: Lowella Grip M.D.   On: 01/13/2014 20:57     EKG Interpretation   Date/Time:  Thursday January 13 2014 17:38:11 EST Ventricular Rate:  66 PR Interval:  167 QRS Duration: 139 QT Interval:  475 QTC Calculation: 498 R Axis:   -44 Text Interpretation:  Sinus rhythm Left bundle branch block [id number] neg,  unchanged from prior Confirmed by Kathrynn Humble, MD, ANKIT [phone]) on  01/13/2014 5:46:43 PM       6:28 PM Patient seen and examined. D/w Dr. Kathrynn Humble. Sepsis orders placed.    Vital signs reviewed and are as follows: BP 99/63 mmHg  Pulse 66  Temp(Src) 99.3 F (37.4 C)  (Rectal)  Resp 23  Ht 5' 5"  (1.651 m)  Wt 200 lb (90.719 kg)  BMI 33.28 kg/m2  SpO2 99%  6:48 PM FAST exam performed at bedside by Dr. Kathrynn Humble. No recent ECHO. Still start fluids at 100cc/hr per Dr. Kathrynn Humble. Pending blood. Hemoccult ordered. Non-CM CT ordered to eval for intraabdominal etiology.   Initial labs show elevation of WBC count to 27,000. Her kidney function is gradually worsening, this appears to be acute. She has elevated BUN. She has elevated total bilirubin and mildly elevated alkaline phosphatase.  9:35 PM Went to update son, no longer at bedside. Patient is more awake, however, still non-verbal.   Spoke with Dr. Alcario Drought who will admit.    MDM   Final diagnoses:  Abdominal pain of unknown etiology  Anemia, unspecified anemia type  Acute kidney injury  Altered mental status, unspecified altered mental status type   Admit.     Carlisle Cater, PA-C 01/13/14 2136  Varney Biles, MD 01/15/14 [phone]

## 2014-01-13 NOTE — ED Notes (Signed)
Pt in CT.

## 2014-01-13 NOTE — ED Notes (Signed)
Gardener, DO at bedside.  

## 2014-01-13 NOTE — ED Notes (Signed)
Pt brought in EMS for abnormal labs and AMS from [facility].  Nursing staff at facility reports that pt is usually sitting in chair and will speak.  Pt has no verbal response at this time.

## 2014-01-13 NOTE — Progress Notes (Signed)
ANTIBIOTIC CONSULT NOTE - INITIAL  Pharmacy Consult:  Vancomycin / Zosyn Indication:  Sepsis  Allergies  Allergen Reactions  . Codeine Nausea And Vomiting  . Sulfa Antibiotics Swelling    Patient Measurements: Height: 5\' 5"  (165.1 cm) Weight: 200 lb (90.719 kg) IBW/kg (Calculated) : 57  Vital Signs: Temp: 99.3 F (37.4 C) (11/26 1756) Temp Source: Rectal (11/26 1756) BP: 97/35 mmHg (11/26 1830) Pulse Rate: 63 (11/26 1830) Intake/Output from this shift: Total I/O In: -  Out: 200 [Urine:200]  Labs:  Recent Labs  01/11/14 01/13/14 1753  WBC  --  27.6*  HGB  --  5.8*  PLT  --  PENDING  CREATININE 1.7*  --    Estimated Creatinine Clearance: 24 mL/min (by C-G formula based on Cr of 1.7). No results for input(s): VANCOTROUGH, VANCOPEAK, VANCORANDOM, GENTTROUGH, GENTPEAK, GENTRANDOM, TOBRATROUGH, TOBRAPEAK, TOBRARND, AMIKACINPEAK, AMIKACINTROU, AMIKACIN in the last 72 hours.   Microbiology: No results found for this or any previous visit (from the past 720 hour(s)).  Medical History: Past Medical History  Diagnosis Date  . Diabetes mellitus without complication   . Leukemia, chronic lymphoid   . Paralysis agitans   . Neuropathy   . Hypertension   . Lumbar stenosis   . Abnormal gait   . Parkinson disease      Assessment: 70 YOM seen last night at [facility] ED s/p fall.  Patient presented today with AMS and Pharmacy consulted to manage vancomycin and Zosyn for sepsis.  Baseline labs reviewed.   Goal of Therapy:  Vancomycin trough level 15-20 mcg/ml   Plan:  - Vanc 1250mg  IV x 1, then 1gm IV Q24H - Zosyn 3.375gm IV x 1 over 30 min, then 3.375gm IV Q8H, 4 hr infusion - Monitor renal fxn, clinical progress, vanc trough as indicated   Dynasty Holquin D. Mina Marble, PharmD, BCPS Pager:  [phone] 01/13/2014, 6:39 PM

## 2014-01-13 NOTE — ED Notes (Signed)
Dr. Alcario Drought paged regarding PO medications.  V/O to hold all PO medications until further notice.

## 2014-01-13 NOTE — ED Notes (Signed)
Son is at bedside and reports that pt was at [facility] ED night before last due to a fall.  Son reports that she had a CT scan and Xray while there that came back normal.

## 2014-01-14 DIAGNOSIS — G92 Toxic encephalopathy: Secondary | ICD-10-CM | POA: Diagnosis present

## 2014-01-14 DIAGNOSIS — Z515 Encounter for palliative care: Secondary | ICD-10-CM

## 2014-01-14 DIAGNOSIS — G929 Unspecified toxic encephalopathy: Secondary | ICD-10-CM | POA: Diagnosis present

## 2014-01-14 DIAGNOSIS — D649 Anemia, unspecified: Secondary | ICD-10-CM

## 2014-01-14 LAB — COMPREHENSIVE METABOLIC PANEL
ALT: <5 U/L (ref 0–35)
AST: 13 U/L (ref 0–37)
Albumin: 2.9 g/dL — ABNORMAL LOW (ref 3.5–5.2)
Alkaline Phosphatase: 109 U/L (ref 39–117)
Anion gap: 11 (ref 5–15)
BILIRUBIN TOTAL: 3.6 mg/dL — AB (ref 0.3–1.2)
BUN: 89 mg/dL — AB (ref 6–23)
CALCIUM: 7.9 mg/dL — AB (ref 8.4–10.5)
CHLORIDE: 103 meq/L (ref 96–112)
CO2: 30 mEq/L (ref 19–32)
Creatinine, Ser: 2.12 mg/dL — ABNORMAL HIGH (ref 0.50–1.10)
GFR calc Af Amer: 22 mL/min — ABNORMAL LOW (ref >90)
GFR calc non Af Amer: 19 mL/min — ABNORMAL LOW (ref >90)
Glucose, Bld: 160 mg/dL — ABNORMAL HIGH (ref 70–99)
Potassium: 4.6 mEq/L (ref 3.7–5.3)
Sodium: 144 mEq/L (ref 137–147)
TOTAL PROTEIN: 4.8 g/dL — AB (ref 6.0–8.3)

## 2014-01-14 LAB — GLUCOSE, CAPILLARY
Glucose-Capillary: 129 mg/dL — ABNORMAL HIGH (ref 70–99)
Glucose-Capillary: 126 mg/dL — ABNORMAL HIGH (ref 70–99)

## 2014-01-14 LAB — CBG MONITORING, ED: Glucose-Capillary: 159 mg/dL — ABNORMAL HIGH (ref 70–99)

## 2014-01-14 LAB — URINE CULTURE
CULTURE: NO GROWTH
Colony Count: NO GROWTH

## 2014-01-14 LAB — FOLATE RBC: RBC Folate: 1168 ng/mL — ABNORMAL HIGH (ref >280)

## 2014-01-14 LAB — CBC
HEMATOCRIT: 22.8 % — AB (ref 36.0–46.0)
Hemoglobin: 7.3 g/dL — ABNORMAL LOW (ref 12.0–15.0)
MCH: 32.6 pg (ref 26.0–34.0)
MCHC: 32 g/dL (ref 30.0–36.0)
MCV: 101.8 fL — AB (ref 78.0–100.0)
Platelets: 102 10*3/uL — ABNORMAL LOW (ref 150–400)
RBC: 2.24 MIL/uL — ABNORMAL LOW (ref 3.87–5.11)
RDW: 24.2 % — AB (ref 11.5–15.5)
WBC: 24.5 10*3/uL — ABNORMAL HIGH (ref 4.0–10.5)

## 2014-01-14 LAB — DIC (DISSEMINATED INTRAVASCULAR COAGULATION)PANEL
Fibrinogen: 337 mg/dL (ref 204–475)
Platelets: 98 10*3/uL — ABNORMAL LOW (ref 150–400)
Smear Review: NONE SEEN
aPTT: 28 seconds (ref 24–37)

## 2014-01-14 LAB — DIC (DISSEMINATED INTRAVASCULAR COAGULATION) PANEL
D DIMER QUANT: 1.81 ug{FEU}/mL — AB (ref 0.00–0.48)
INR: 1.29 (ref 0.00–1.49)
PROTHROMBIN TIME: 16.2 s — AB (ref 11.6–15.2)

## 2014-01-14 LAB — MRSA PCR SCREENING: MRSA by PCR: NEGATIVE

## 2014-01-14 LAB — VITAMIN B12: Vitamin B-12: 225 pg/mL (ref 211–911)

## 2014-01-14 LAB — TECHNOLOGIST SMEAR REVIEW

## 2014-01-14 MED ORDER — MORPHINE SULFATE 2 MG/ML IJ SOLN
2.0000 mg | Freq: Once | INTRAMUSCULAR | Status: AC
Start: 2014-01-14 — End: 2014-01-14
  Administered 2014-01-14: 2 mg via INTRAVENOUS

## 2014-01-14 MED ORDER — VANCOMYCIN HCL IN DEXTROSE 1-5 GM/200ML-% IV SOLN: 1000.0000 mg | INTRAVENOUS | Status: DC

## 2014-01-14 MED ORDER — MORPHINE SULFATE 2 MG/ML IJ SOLN
2.0000 mg | INTRAMUSCULAR | Status: DC | PRN
  Administered 2014-01-14 – 2014-01-17 (×5): 2 mg via INTRAVENOUS
  Filled 2014-01-14 (×7): qty 1

## 2014-01-14 MED ORDER — PIPERACILLIN-TAZOBACTAM IN DEX 2-0.25 GM/50ML IV SOLN
2.2500 g | Freq: Four times a day (QID) | INTRAVENOUS | Status: DC
  Filled 2014-01-14 (×3): qty 50

## 2014-01-14 NOTE — Progress Notes (Signed)
[facility] PROGRESS NOTE  Vanessa Carroll LFY:[id number] DOB: [birth year] DOA: 01/13/2014 PCP: Estill Dooms, MD  Assessment/Plan:  Principal Problem:   Severe sepsis with acute organ dysfunction Active Problems:   Type 2 diabetes mellitus with diabetic neuropathy   CLL (chronic lymphocytic leukemia)   Parkinson disease   HTN (hypertension)   Edema   AKI (acute kidney injury)   Anemia   Thrombocytopenia   Encephalopathy, toxic  Long discussion with son. Pt remains critically ill with guarded prognosis. She has been deteriorating and has poor quality of life, has been in pain, and told him last weak she was "ready to go".  He requests pt be comfort care only. She has expressed her wishes to him in the past.  Stop all tests and meds except for comfort care.  Will DC monitor. Order when necessary morphine. Once comfortable, transfer to [place]. Patient may have an in-hospital death. Should she stabilized to the point where she could be transferred to residential hospice, son would like for her to go to hospice of [facility]. She may not survive the weekend however.  HPI/Subjective: C/p pain all over  Objective: Filed Vitals:   01/14/14 0730  BP: 85/28  Pulse: 57  Temp: 97.8 F (36.6 C)  Resp: 15    Intake/Output Summary (Last 24 hours) at 01/14/14 1003 Last data filed at 01/14/14 0148  Gross per 24 hour  Intake  797.5 ml  Output    200 ml  Net  597.5 ml   Filed Weights   01/13/14 1737  Weight: 90.719 kg (200 lb)    Exam:   General:  Asleep. Arousable. Obese. Falls immediately back asleep  Cardiovascular: RRR without MGR  Respiratory: diminished throughout without WRR  Abdomen: Obese. Soft. Mild diffuse tenderness  Ext: 3+ pitting edema  Basic Metabolic Panel:  Recent Labs Lab 01/11/14 01/13/14 1753 01/14/14 0255  NA 144 142 144  K 4.3 5.6* 4.6  CL  --  102 103  CO2  --  31 30  GLUCOSE  --  163* 160*  BUN 67* 84* 89*  CREATININE 1.7* 2.11*  2.12*  CALCIUM  --  8.2* 7.9*   Liver Function Tests:  Recent Labs Lab 01/13/14 1753 01/14/14 0255  AST 15 13  ALT <5 <5  ALKPHOS 120* 109  BILITOT 2.5* 3.6*  PROT 5.0* 4.8*  ALBUMIN 3.1* 2.9*   No results for input(s): LIPASE, AMYLASE in the last 168 hours. No results for input(s): AMMONIA in the last 168 hours. CBC:  Recent Labs Lab 01/13/14 1753 01/14/14 0255  WBC 27.6* 24.5*  HGB 5.8* 7.3*  HCT 18.1* 22.8*  MCV 107.7* 101.8*  PLT 112* 98*  102*   Cardiac Enzymes: No results for input(s): CKTOTAL, CKMB, CKMBINDEX, TROPONINI in the last 168 hours. BNP (last 3 results)  Recent Labs  01/13/14 1851  PROBNP 1828.0*   CBG:  Recent Labs Lab 01/13/14 1808 01/13/14 2237 01/14/14 0056 01/14/14 0619 01/14/14 0747  GLUCAP 179* 170* 159* 129* 126*    Recent Results (from the past 240 hour(s))  MRSA PCR Screening     Status: None   Collection Time: 01/14/14  1:57 AM  Result Value Ref Range Status   MRSA by PCR NEGATIVE NEGATIVE Final    Comment:        The GeneXpert MRSA Assay (FDA approved for NASAL specimens only), is one component of a comprehensive MRSA colonization surveillance program. It is not intended to diagnose MRSA infection nor  to guide or monitor treatment for MRSA infections.      Studies: Ct Abdomen Pelvis Wo Contrast  01/13/2014   CLINICAL DATA:  80 year old female with worsening renal function and generalized abdominal pain. Evaluate for abdominal aortic aneurysm or signs of occult bleeding.  EXAM: CT ABDOMEN AND PELVIS WITHOUT CONTRAST  TECHNIQUE: Multidetector CT imaging of the abdomen and pelvis was performed following the standard protocol without IV contrast.  COMPARISON:  No priors.  FINDINGS: Lower chest: Trace right pleural effusion. Subsegmental atelectasis in lung bases bilaterally.  Hepatobiliary: Status post cholecystectomy. The liver has a shrunken appearance and nodular contour, suggestive of underlying cirrhosis. No  suspicious appearing hepatic lesions are noted on today's non contrast CT examination. There are some calcifications along the posterior aspect of the right lobe of the liver, presumably from prior trauma.  Pancreas: Unremarkable.  Spleen: The spleen is enlarged measuring 14.9 x 4.5 x 14 cm (estimated splenic volume of 469 mL). In addition, in the superior aspect of the spleen there is a 3.4 x 2.8 x 2.6 cm intermediate attenuation (40 HU) lesion that is incompletely characterized.  Adrenals/Urinary Tract: The unenhanced appearance of the kidneys and adrenal glands is normal bilaterally. No hydroureteronephrosis. Urinary bladder is normal in appearance.  Stomach/Bowel: The unenhanced appearance of the stomach is unremarkable. No pathologic dilatation of small bowel or colon.  Vascular/Lymphatic: Extensive atherosclerosis throughout the abdominal and pelvic vasculature, without evidence of aneurysm. No lymphadenopathy noted in the abdomen or pelvis on today's non contrast CT examination.  Reproductive: Status post hysterectomy.  Other: Small volume of ascites tracking down into the pelvis field left paracolic gutter. No pneumoperitoneum. No high attenuation intraperitoneal or retroperitoneal fluid collection to suggest a cold hemorrhage.  Musculoskeletal: There are no aggressive appearing lytic or blastic lesions noted in the visualized portions of the skeleton.  IMPRESSION: 1. No signs of occult hemorrhage in the abdomen or pelvis. 2. No evidence of abdominal aortic aneurysm. There is extensive atherosclerosis throughout the abdominal and pelvic vasculature. 3. Morphologic changes in the liver compatible with cirrhosis. Associated with this is a trace volume of ascites. There is also splenomegaly, which could be related to portal hypertension. 4. In addition, in the superior aspect of the spleen there is a 3.4 x 2.8 x 2.6 cm intermediate attenuation lesion which is incompletely characterized, but similar in size in  retrospect compared to prior study 02/16/2009, likely a hemangioma. 5. Additional incidental findings, as above.   Electronically Signed   By: Vinnie Langton M.D.   On: 01/13/2014 20:23   Dg Chest Port 1 View  01/13/2014   CLINICAL DATA:  Altered mental status  EXAM: PORTABLE CHEST - 1 VIEW  COMPARISON:  January 11, 2014  FINDINGS: There is atelectatic change in the left base. There is no edema or consolidation. Heart is mildly enlarged with pulmonary vascularity within normal limits. There is atherosclerotic change in the aorta. No pneumothorax. No adenopathy. There is arthropathy in the left shoulder.  IMPRESSION: No edema or consolidation. Atelectatic change left base. Stable cardiac enlargement.   Electronically Signed   By: Lowella Grip M.D.   On: 01/13/2014 20:57    Scheduled Meds: .  morphine injection  2 mg Intravenous Once   Continuous Infusions: . sodium chloride 75 mL/hr at 01/13/14 2240    Time spent: 45 minutes  [facility] Text page [email or web address], password [facility] 01/14/2014, 10:03 AM  LOS: 1 day

## 2014-01-14 NOTE — Progress Notes (Signed)
ANTIBIOTIC CONSULT NOTE - Follow-up  Pharmacy Consult:  Vancomycin / Zosyn Indication:  Sepsis  Allergies  Allergen Reactions  . Codeine Nausea And Vomiting  . Sulfa Antibiotics Swelling    Patient Measurements: Height: 5\' 5"  (165.1 cm) Weight: 200 lb (90.719 kg) IBW/kg (Calculated) : 57  Vital Signs: Temp: 97.8 F (36.6 C) (11/27 0730) Temp Source: Axillary (11/27 0730) BP: 85/28 mmHg (11/27 0730) Pulse Rate: 57 (11/27 0730) Intake/Output from this shift:    Labs:  Recent Labs  01/13/14 1753 01/14/14 0255  WBC 27.6* 24.5*  HGB 5.8* 7.3*  PLT 112* 98*  102*  CREATININE 2.11* 2.12*   Estimated Creatinine Clearance: 19.2 mL/min (by C-G formula based on Cr of 2.12). No results for input(s): VANCOTROUGH, VANCOPEAK, VANCORANDOM, GENTTROUGH, GENTPEAK, GENTRANDOM, TOBRATROUGH, TOBRAPEAK, TOBRARND, AMIKACINPEAK, AMIKACINTROU, AMIKACIN in the last 72 hours.   Microbiology: Recent Results (from the past 720 hour(s))  MRSA PCR Screening     Status: None   Collection Time: 01/14/14  1:57 AM  Result Value Ref Range Status   MRSA by PCR NEGATIVE NEGATIVE Final    Comment:        The GeneXpert MRSA Assay (FDA approved for NASAL specimens only), is one component of a comprehensive MRSA colonization surveillance program. It is not intended to diagnose MRSA infection nor to guide or monitor treatment for MRSA infections.     Assessment: 89 YOM seen last night at [facility] ED s/p fall then presented to [facility] with AMS. Started on broad-spectrum antibiotics of zosyn and vancomycin. Scr has worsened slightly so doses now require adjustment. Pt is afebrile but WBC remains elevated at 24.5.   Vanc 11/26 >> Zosyn 11/26 >>  11/27 MRSA - NEG 11/26 Blood - pending 11/26 Urine - pending  Goal of Therapy:  Vancomycin trough level 15-20 mcg/ml  Plan:  1. Change vancomycin to 1gm IV Q48H 2. Change zosyn to 2.25gm IV Q6H 3. F/u renal fxn, C&S, clinical status and trough at  [facility], PharmD, BCPS Pager # [phone] 01/14/2014 9:10 AM

## 2014-01-14 NOTE — Progress Notes (Signed)
Patient son has questions about boluses being used as a means of prolonging life. Education provided. Changed blood pressure cuff to a smaller fitting cuff. Son would also like to discuss the plan for mother's care. Will inform oncoming shift his concerns about wanting to talk with doctors. Will continue to monitor patient status. Vanessa Carroll

## 2014-01-14 NOTE — ED Notes (Signed)
Per Gardener, DO 54mL bolus started.

## 2014-01-15 DIAGNOSIS — Z515 Encounter for palliative care: Secondary | ICD-10-CM

## 2014-01-15 MED ORDER — FENTANYL 12 MCG/HR TD PT72
12.5000 ug | MEDICATED_PATCH | TRANSDERMAL | Status: DC
  Administered 2014-01-15: 12.5 ug via TRANSDERMAL
  Filled 2014-01-15: qty 1

## 2014-01-15 NOTE — Progress Notes (Addendum)
[facility] PROGRESS NOTE  Vanessa Carroll:[id number] DOB: 1920/05/15 DOA: 01/13/2014 PCP: Estill Dooms, MD  Assessment/Plan:  Principal Problem:   Severe sepsis with acute organ dysfunction Active Problems:   Type 2 diabetes mellitus with diabetic neuropathy   CLL (chronic lymphocytic leukemia)   Parkinson disease   HTN (hypertension)   Edema   AKI (acute kidney injury)   Anemia   Thrombocytopenia   Encephalopathy, toxic   Palliative care encounter  Blood pressure has stabilized, but  Remains poorly responsive.  Discussed with family. May stabilize enough to transfer to residential hospice Monday. Will consult SW.  Add duragesic, as patient may be unable to communicate need for pain meds  HPI/Subjective: C/o pain. Denies other complaints.  Objective: Filed Vitals:   01/15/14 0928  BP: 129/38  Pulse: 64  Temp: 98.1 F (36.7 C)  Resp: 19    Intake/Output Summary (Last 24 hours) at 01/15/14 1350 Last data filed at 01/15/14 0915  Gross per 24 hour  Intake      0 ml  Output   1200 ml  Net  -1200 ml   Filed Weights   01/13/14 1737  Weight: 90.719 kg (200 lb)    Exam:   General:  Asleep. Arousable. Pale, does not focus. Whispers one word answers  HEENT: pale conjuctivitis  Cardiovascular: RRR without MGR  Respiratory: diminished throughout without WRR  Abdomen: Obese. Soft. Mild diffuse tenderness  Ext: 2+ pitting edema  Basic Metabolic Panel:  Recent Labs Lab 01/11/14 01/13/14 1753 01/14/14 0255  NA 144 142 144  K 4.3 5.6* 4.6  CL  --  102 103  CO2  --  31 30  GLUCOSE  --  163* 160*  BUN 67* 84* 89*  CREATININE 1.7* 2.11* 2.12*  CALCIUM  --  8.2* 7.9*   Liver Function Tests:  Recent Labs Lab 01/13/14 1753 01/14/14 0255  AST 15 13  ALT <5 <5  ALKPHOS 120* 109  BILITOT 2.5* 3.6*  PROT 5.0* 4.8*  ALBUMIN 3.1* 2.9*   No results for input(s): LIPASE, AMYLASE in the last 168 hours. No results for input(s): AMMONIA in the  last 168 hours. CBC:  Recent Labs Lab 01/13/14 1753 01/14/14 0255  WBC 27.6* 24.5*  HGB 5.8* 7.3*  HCT 18.1* 22.8*  MCV 107.7* 101.8*  PLT 112* 98*  102*   Cardiac Enzymes: No results for input(s): CKTOTAL, CKMB, CKMBINDEX, TROPONINI in the last 168 hours. BNP (last 3 results)  Recent Labs  01/13/14 1851  PROBNP 1828.0*   CBG:  Recent Labs Lab 01/13/14 1808 01/13/14 2237 01/14/14 0056 01/14/14 0619 01/14/14 0747  GLUCAP 179* 170* 159* 129* 126*    Recent Results (from the past 240 hour(s))  Urine culture     Status: None   Collection Time: 01/13/14  6:00 PM  Result Value Ref Range Status   Specimen Description URINE, CATHETERIZED  Final   Special Requests ADDED [id number]  Final   Culture  Setup Time   Final    01/14/2014 04:19 Performed at [place] Performed at [facility]   Final   Culture NO GROWTH Performed at [facility]   Final   Report Status 01/14/2014 FINAL  Final  MRSA PCR Screening     Status: None   Collection Time: 01/14/14  1:57 AM  Result Value Ref Range Status   MRSA by PCR NEGATIVE NEGATIVE Final    Comment:  The GeneXpert MRSA Assay (FDA approved for NASAL specimens only), is one component of a comprehensive MRSA colonization surveillance program. It is not intended to diagnose MRSA infection nor to guide or monitor treatment for MRSA infections.      Studies: Ct Abdomen Pelvis Wo Contrast  01/13/2014   CLINICAL DATA:  80 year old female with worsening renal function and generalized abdominal pain. Evaluate for abdominal aortic aneurysm or signs of occult bleeding.  EXAM: CT ABDOMEN AND PELVIS WITHOUT CONTRAST  TECHNIQUE: Multidetector CT imaging of the abdomen and pelvis was performed following the standard protocol without IV contrast.  COMPARISON:  No priors.  FINDINGS: Lower chest: Trace right pleural effusion. Subsegmental atelectasis in lung bases bilaterally.   Hepatobiliary: Status post cholecystectomy. The liver has a shrunken appearance and nodular contour, suggestive of underlying cirrhosis. No suspicious appearing hepatic lesions are noted on today's non contrast CT examination. There are some calcifications along the posterior aspect of the right lobe of the liver, presumably from prior trauma.  Pancreas: Unremarkable.  Spleen: The spleen is enlarged measuring 14.9 x 4.5 x 14 cm (estimated splenic volume of 469 mL). In addition, in the superior aspect of the spleen there is a 3.4 x 2.8 x 2.6 cm intermediate attenuation (40 HU) lesion that is incompletely characterized.  Adrenals/Urinary Tract: The unenhanced appearance of the kidneys and adrenal glands is normal bilaterally. No hydroureteronephrosis. Urinary bladder is normal in appearance.  Stomach/Bowel: The unenhanced appearance of the stomach is unremarkable. No pathologic dilatation of small bowel or colon.  Vascular/Lymphatic: Extensive atherosclerosis throughout the abdominal and pelvic vasculature, without evidence of aneurysm. No lymphadenopathy noted in the abdomen or pelvis on today's non contrast CT examination.  Reproductive: Status post hysterectomy.  Other: Small volume of ascites tracking down into the pelvis field left paracolic gutter. No pneumoperitoneum. No high attenuation intraperitoneal or retroperitoneal fluid collection to suggest a cold hemorrhage.  Musculoskeletal: There are no aggressive appearing lytic or blastic lesions noted in the visualized portions of the skeleton.  IMPRESSION: 1. No signs of occult hemorrhage in the abdomen or pelvis. 2. No evidence of abdominal aortic aneurysm. There is extensive atherosclerosis throughout the abdominal and pelvic vasculature. 3. Morphologic changes in the liver compatible with cirrhosis. Associated with this is a trace volume of ascites. There is also splenomegaly, which could be related to portal hypertension. 4. In addition, in the superior  aspect of the spleen there is a 3.4 x 2.8 x 2.6 cm intermediate attenuation lesion which is incompletely characterized, but similar in size in retrospect compared to prior study 02/16/2009, likely a hemangioma. 5. Additional incidental findings, as above.   Electronically Signed   By: Vinnie Langton M.D.   On: 01/13/2014 20:23   Dg Chest Port 1 View  01/13/2014   CLINICAL DATA:  Altered mental status  EXAM: PORTABLE CHEST - 1 VIEW  COMPARISON:  January 11, 2014  FINDINGS: There is atelectatic change in the left base. There is no edema or consolidation. Heart is mildly enlarged with pulmonary vascularity within normal limits. There is atherosclerotic change in the aorta. No pneumothorax. No adenopathy. There is arthropathy in the left shoulder.  IMPRESSION: No edema or consolidation. Atelectatic change left base. Stable cardiac enlargement.   Electronically Signed   By: Lowella Grip M.D.   On: 01/13/2014 20:57    Scheduled Meds:   Continuous Infusions: . sodium chloride 10 mL/hr at 01/14/14 1038    Time spent: 25 minutes  [facility] Text  page [email or web address], password [facility] 01/15/2014, 1:50 PM  LOS: 2 days

## 2014-01-16 MED ORDER — MORPHINE SULFATE 2 MG/ML IJ SOLN
2.0000 mg | Freq: Once | INTRAMUSCULAR | Status: AC
  Administered 2014-01-16: 2 mg via INTRAVENOUS
  Filled 2014-01-16: qty 1

## 2014-01-16 NOTE — Plan of Care (Signed)
Problem: Phase II Progression Outcomes Goal: Pain within acceptable level for patient Outcome: Completed/Met Date Met:  01/16/14 Goal: Non-pain symptoms managed Outcome: Completed/Met Date Met:  01/16/14 Goal: Pt./family involved in care progression Outcome: Completed/Met Date Met:  01/16/14

## 2014-01-16 NOTE — Clinical Social Work Note (Signed)
CSW received consult for residential hospice. CSW met with patient's son Liliane Channel) who stated preference for [facility]. CSW contacted [facility] and spoke with Mechele Claude, who made CSW aware liaison Beverlee Nims) will contact either son Liliane Channel [phone]) or Sonia Side [phone]) to schedule appointment to meet with them and evaluate patient. CSW to follow tomorrow.   [place], [place] Weekend Clinical Social Worker [phone]

## 2014-01-16 NOTE — Progress Notes (Signed)
[facility] PROGRESS NOTE  Vanessa Carroll EXB:[id number] DOB: 05-Nov-1920 DOA: 01/13/2014 PCP: Estill Dooms, MD  Summary 14 female from ALF with recent pneumonia, parkinsons, CLL brought to ED with altered mental status. Found to be anemic, in acute renal failure, hypotension and likely septic.  Transfused, fluid rescussitated and started on broad spectrum antibiotics.  Family subsequently requested change to comfort measures only.  Assessment/Plan:  Principal Problem:   Severe sepsis with acute organ dysfunction Active Problems:   Type 2 diabetes mellitus with diabetic neuropathy   CLL (chronic lymphocytic leukemia)   Parkinson disease   HTN (hypertension)   Edema   AKI (acute kidney injury)   Anemia   Thrombocytopenia   Encephalopathy, toxic   Palliative care encounter  Blood pressure has stabilized, but  Remains poorly responsive.  Discussed with family. May stabilize enough to transfer to residential hospice Monday. Will consult SW.  Add duragesic, as patient may be unable to communicate need for pain meds  HPI/Subjective: C/o pain. Denies other complaints.  Objective: Filed Vitals:   01/16/14 0900  BP: 159/48  Pulse: 66  Temp: 99.1 F (37.3 C)  Resp: 17    Intake/Output Summary (Last 24 hours) at 01/16/14 1821 Last data filed at 01/16/14 1100  Gross per 24 hour  Intake     60 ml  Output    950 ml  Net   -890 ml   Filed Weights   01/13/14 1737  Weight: 90.719 kg (200 lb)    Exam:   General:  Asleep. Arousable. Pale, does not focus. Whispers one word answers  HEENT: pale conjuctivitis  Cardiovascular: RRR without MGR  Respiratory: diminished throughout without WRR  Abdomen: Obese. Soft. Mild diffuse tenderness  Ext: 2+ pitting edema  Basic Metabolic Panel:  Recent Labs Lab 01/11/14 01/13/14 1753 01/14/14 0255  NA 144 142 144  K 4.3 5.6* 4.6  CL  --  102 103  CO2  --  31 30  GLUCOSE  --  163* 160*  BUN 67* 84* 89*  CREATININE  1.7* 2.11* 2.12*  CALCIUM  --  8.2* 7.9*   Liver Function Tests:  Recent Labs Lab 01/13/14 1753 01/14/14 0255  AST 15 13  ALT <5 <5  ALKPHOS 120* 109  BILITOT 2.5* 3.6*  PROT 5.0* 4.8*  ALBUMIN 3.1* 2.9*   No results for input(s): LIPASE, AMYLASE in the last 168 hours. No results for input(s): AMMONIA in the last 168 hours. CBC:  Recent Labs Lab 01/13/14 1753 01/14/14 0255  WBC 27.6* 24.5*  HGB 5.8* 7.3*  HCT 18.1* 22.8*  MCV 107.7* 101.8*  PLT 112* 98*  102*   Cardiac Enzymes: No results for input(s): CKTOTAL, CKMB, CKMBINDEX, TROPONINI in the last 168 hours. BNP (last 3 results)  Recent Labs  01/13/14 1851  PROBNP 1828.0*   CBG:  Recent Labs Lab 01/13/14 1808 01/13/14 2237 01/14/14 0056 01/14/14 0619 01/14/14 0747  GLUCAP 179* 170* 159* 129* 126*    Recent Results (from the past 240 hour(s))  Urine culture     Status: None   Collection Time: 01/13/14  6:00 PM  Result Value Ref Range Status   Specimen Description URINE, CATHETERIZED  Final   Special Requests ADDED [id number]  Final   Culture  Setup Time   Final    01/14/2014 04:19 Performed at [place] Performed at [facility]   Final   Culture NO GROWTH Performed at [facility]   Final   Report Status 01/14/2014 FINAL  Final  Blood Culture (routine x 2)     Status: None (Preliminary result)   Collection Time: 01/13/14  6:51 PM  Result Value Ref Range Status   Specimen Description BLOOD HAND RIGHT  Final   Special Requests BOTTLES DRAWN AEROBIC AND ANAEROBIC 5CC  Final   Culture  Setup Time   Final    01/14/2014 01:32 Performed at [facility]    Culture   Final           BLOOD CULTURE RECEIVED NO GROWTH TO DATE CULTURE WILL BE HELD FOR 5 DAYS BEFORE ISSUING A FINAL NEGATIVE REPORT Performed at [facility]    Report Status PENDING  Incomplete  Blood Culture (routine x 2)     Status: None (Preliminary result)    Collection Time: 01/13/14  6:57 PM  Result Value Ref Range Status   Specimen Description BLOOD ARM RIGHT  Final   Special Requests BOTTLES DRAWN AEROBIC AND ANAEROBIC 5CC  Final   Culture  Setup Time   Final    01/14/2014 01:32 Performed at [facility]    Culture   Final           BLOOD CULTURE RECEIVED NO GROWTH TO DATE CULTURE WILL BE HELD FOR 5 DAYS BEFORE ISSUING A FINAL NEGATIVE REPORT Performed at [facility]    Report Status PENDING  Incomplete  MRSA PCR Screening     Status: None   Collection Time: 01/14/14  1:57 AM  Result Value Ref Range Status   MRSA by PCR NEGATIVE NEGATIVE Final    Comment:        The GeneXpert MRSA Assay (FDA approved for NASAL specimens only), is one component of a comprehensive MRSA colonization surveillance program. It is not intended to diagnose MRSA infection nor to guide or monitor treatment for MRSA infections.      Studies: No results found.  Scheduled Meds: . fentaNYL  12.5 mcg Transdermal Q72H   Continuous Infusions: . sodium chloride 10 mL/hr at 01/14/14 1038    Time spent: 15 minutes  [facility] Text page [email or web address], password [email or web address] 01/16/2014, 6:21 PM  LOS: 3 days

## 2014-01-17 DIAGNOSIS — C9110 Chronic lymphocytic leukemia of B-cell type not having achieved remission: Secondary | ICD-10-CM

## 2014-01-17 LAB — TYPE AND SCREEN
ABO/RH(D): O NEG
Antibody Screen: NEGATIVE
Unit division: 0
Unit division: 0
Unit division: 0
Unit division: 0

## 2014-01-17 MED ORDER — MORPHINE SULFATE 2 MG/ML IJ SOLN: 2.0000 mg | INTRAMUSCULAR | Status: AC | PRN

## 2014-01-17 MED ORDER — FENTANYL 12 MCG/HR TD PT72: 12.5000 ug | MEDICATED_PATCH | TRANSDERMAL | Status: AC

## 2014-01-17 NOTE — Discharge Summary (Signed)
Physician Discharge Summary  Vanessa Carroll:[id number] DOB: 11-Apr-1920 DOA: 01/13/2014  PCP: Estill Dooms, MD  Admit date: 01/13/2014 Discharge date: 01/17/2014  Time spent: 35 minutes  Recommendations for Outpatient Follow-up:  1. Comfort care to inpatient hospice  Discharge Diagnoses:  Principal Problem:   Severe sepsis with acute organ dysfunction Active Problems:   Type 2 diabetes mellitus with diabetic neuropathy   CLL (chronic lymphocytic leukemia)   Parkinson disease   HTN (hypertension)   Edema   AKI (acute kidney injury)   Anemia   Thrombocytopenia   Encephalopathy, toxic   Palliative care encounter   Discharge Condition: terminal  Diet recommendation: as tolerated  Filed Weights   01/13/14 1737  Weight: 90.719 kg (200 lb)    History of present illness:  Vanessa Carroll is a 80 y.o. female who has been being treated at her NH for PNA, presents to ED with worsening mental status over the past day. 2 days ago she was seen at [facility] ED for a fall (CT head negative at that time), today she was not responsive verbally which is a new and major change for her. As a result son had her brought to ED.   Hospital Course:  Blood pressure has stabilized. patient remains poorly responsive. Discussed with family.   transfer to residential hospice Monday  Procedures:    Consultations:    Discharge Exam: Filed Vitals:   01/17/14 0638  BP: 166/52  Pulse: 70  Temp: 97.6 F (36.4 C)  Resp: 18     Discharge Instructions You were cared for by a hospitalist during your hospital stay. If you have any questions about your discharge medications or the care you received while you were in the hospital after you are discharged, you can call the unit and asked to speak with the hospitalist on call if the hospitalist that took care of you is not available. Once you are discharged, your primary care physician will handle any further medical issues. Please note that NO  REFILLS for any discharge medications will be authorized once you are discharged, as it is imperative that you return to your primary care physician (or establish a relationship with a primary care physician if you do not have one) for your aftercare needs so that they can reassess your need for medications and monitor your lab values.   Current Discharge Medication List    START taking these medications   Details  fentaNYL (DURAGESIC - DOSED MCG/HR) 12 MCG/HR Place 1 patch (12.5 mcg total) onto the skin every 3 (three) days. Qty: 5 patch, Refills: 0    morphine 2 MG/ML injection Inject 1 mL (2 mg total) into the vein every 2 (two) hours as needed (discomfort). Qty: 1 mL, Refills: 0      STOP taking these medications     acetaminophen (TYLENOL) 650 MG CR tablet      atenolol (TENORMIN) 50 MG tablet      carbidopa-levodopa (SINEMET IR) 25-250 MG per tablet      Cholecalciferol (VITAMIN D) 2000 UNITS tablet      cloNIDine (CATAPRES) 0.1 MG tablet      doxazosin (CARDURA) 4 MG tablet      gabapentin (NEURONTIN) 600 MG tablet      guaiFENesin (MUCINEX) 600 MG 12 hr tablet      guaiFENesin-dextromethorphan (ROBITUSSIN DM) 100-10 MG/5ML syrup      ipratropium-albuterol (DUONEB) 0.5-2.5 (3) MG/3ML SOLN      levothyroxine (SYNTHROID, LEVOTHROID) 100 MCG  tablet      losartan (COZAAR) 100 MG tablet      meloxicam (MOBIC) 15 MG tablet      moxifloxacin (AVELOX) 400 MG tablet      Polyethyl Glycol-Propyl Glycol (SYSTANE) 0.4-0.3 % SOLN      polyethylene glycol (MIRALAX / GLYCOLAX) packet      saccharomyces boulardii (FLORASTOR) 250 MG capsule      senna (SENOKOT) 8.6 MG tablet      torsemide (DEMADEX) 20 MG tablet      traMADol (ULTRAM) 50 MG tablet        Allergies  Allergen Reactions  . Codeine Nausea And Vomiting  . Sulfa Antibiotics Swelling      The results of significant diagnostics from this hospitalization (including imaging, microbiology, ancillary and  laboratory) are listed below for reference.    Significant Diagnostic Studies: Ct Abdomen Pelvis Wo Contrast  01/13/2014   CLINICAL DATA:  80 year old female with worsening renal function and generalized abdominal pain. Evaluate for abdominal aortic aneurysm or signs of occult bleeding.  EXAM: CT ABDOMEN AND PELVIS WITHOUT CONTRAST  TECHNIQUE: Multidetector CT imaging of the abdomen and pelvis was performed following the standard protocol without IV contrast.  COMPARISON:  No priors.  FINDINGS: Lower chest: Trace right pleural effusion. Subsegmental atelectasis in lung bases bilaterally.  Hepatobiliary: Status post cholecystectomy. The liver has a shrunken appearance and nodular contour, suggestive of underlying cirrhosis. No suspicious appearing hepatic lesions are noted on today's non contrast CT examination. There are some calcifications along the posterior aspect of the right lobe of the liver, presumably from prior trauma.  Pancreas: Unremarkable.  Spleen: The spleen is enlarged measuring 14.9 x 4.5 x 14 cm (estimated splenic volume of 469 mL). In addition, in the superior aspect of the spleen there is a 3.4 x 2.8 x 2.6 cm intermediate attenuation (40 HU) lesion that is incompletely characterized.  Adrenals/Urinary Tract: The unenhanced appearance of the kidneys and adrenal glands is normal bilaterally. No hydroureteronephrosis. Urinary bladder is normal in appearance.  Stomach/Bowel: The unenhanced appearance of the stomach is unremarkable. No pathologic dilatation of small bowel or colon.  Vascular/Lymphatic: Extensive atherosclerosis throughout the abdominal and pelvic vasculature, without evidence of aneurysm. No lymphadenopathy noted in the abdomen or pelvis on today's non contrast CT examination.  Reproductive: Status post hysterectomy.  Other: Small volume of ascites tracking down into the pelvis field left paracolic gutter. No pneumoperitoneum. No high attenuation intraperitoneal or retroperitoneal  fluid collection to suggest a cold hemorrhage.  Musculoskeletal: There are no aggressive appearing lytic or blastic lesions noted in the visualized portions of the skeleton.  IMPRESSION: 1. No signs of occult hemorrhage in the abdomen or pelvis. 2. No evidence of abdominal aortic aneurysm. There is extensive atherosclerosis throughout the abdominal and pelvic vasculature. 3. Morphologic changes in the liver compatible with cirrhosis. Associated with this is a trace volume of ascites. There is also splenomegaly, which could be related to portal hypertension. 4. In addition, in the superior aspect of the spleen there is a 3.4 x 2.8 x 2.6 cm intermediate attenuation lesion which is incompletely characterized, but similar in size in retrospect compared to prior study 02/16/2009, likely a hemangioma. 5. Additional incidental findings, as above.   Electronically Signed   By: Vinnie Langton M.D.   On: 01/13/2014 20:23   Dg Ribs Unilateral W/chest Right  01/11/2014   CLINICAL DATA:  Pt c/o entire right side of body pain s/p fall at [facility] home today, pt reports  she was walking down hall and her "legs gave out" causing her to fall. Pt has pain under right breast  EXAM: RIGHT RIBS AND CHEST - 3+ VIEW  COMPARISON:  None.  FINDINGS: No fracture or other bone lesions are seen involving the ribs. There is no evidence of pneumothorax or pleural effusion. Both lungs are clear. Heart size and mediastinal contours are within normal limits.  IMPRESSION: Negative.   Electronically Signed   By: Kathreen Devoid   On: 01/11/2014 20:18   Dg Pelvis 1-2 Views  01/11/2014   CLINICAL DATA:  Right-sided body pain after fall at retirement home today  EXAM: PELVIS - 1-2 VIEW  COMPARISON:  02/16/2009  FINDINGS: Degenerative disc disease noted within the lower lumbar spine. Mild osteoarthritis involves both hip joints. No acute fracture or subluxation identified.  IMPRESSION: 1. No acute findings. 2. Chronic degenerative change.    Electronically Signed   By: Kerby Moors M.D.   On: 01/11/2014 20:20   Ct Head Wo Contrast  01/11/2014   CLINICAL DATA:  Status post fall  EXAM: CT HEAD WITHOUT CONTRAST  CT CERVICAL SPINE WITHOUT CONTRAST  TECHNIQUE: Multidetector CT imaging of the head and cervical spine was performed following the standard protocol without intravenous contrast. Multiplanar CT image reconstructions of the cervical spine were also generated.  COMPARISON:  12/19/7 and 12/21/2010  FINDINGS: CT HEAD FINDINGS  Mild low attenuation throughout the subcortical and periventricular white matter is identified compatible with chronic microvascular disease. Prominence of the sulci and ventricles identified consistent with brain atrophy. No acute infarct, hemorrhage or mass identified. The paranasal sinuses are clear. The mastoid air cells are clear. The calvarium appears intact.  CT CERVICAL SPINE FINDINGS  There is straightening of normal cervical lordosis. The vertebral body heights are well preserved. There is a mild antral listhesis of C4 on C5. Multi level disc space narrowing and ventral endplate spurring is identified. This is most advanced at C5-6 and C6-7. Bilateral facet hypertrophy and degenerative changes noted. The prevertebral soft tissue space appears normal. Calcified atherosclerotic disease involves the carotid arteries.  IMPRESSION: 1. No acute intracranial abnormalities. 2. Chronic small vessel ischemic disease and brain atrophy. 3. Cervical spondylosis. No evidence for cervical spine fracture or dislocation. 4. Atherosclerotic disease.   Electronically Signed   By: Kerby Moors M.D.   On: 01/11/2014 20:15   Ct Cervical Spine Wo Contrast  01/11/2014   CLINICAL DATA:  Status post fall  EXAM: CT HEAD WITHOUT CONTRAST  CT CERVICAL SPINE WITHOUT CONTRAST  TECHNIQUE: Multidetector CT imaging of the head and cervical spine was performed following the standard protocol without intravenous contrast. Multiplanar CT image  reconstructions of the cervical spine were also generated.  COMPARISON:  12/19/7 and 12/21/2010  FINDINGS: CT HEAD FINDINGS  Mild low attenuation throughout the subcortical and periventricular white matter is identified compatible with chronic microvascular disease. Prominence of the sulci and ventricles identified consistent with brain atrophy. No acute infarct, hemorrhage or mass identified. The paranasal sinuses are clear. The mastoid air cells are clear. The calvarium appears intact.  CT CERVICAL SPINE FINDINGS  There is straightening of normal cervical lordosis. The vertebral body heights are well preserved. There is a mild antral listhesis of C4 on C5. Multi level disc space narrowing and ventral endplate spurring is identified. This is most advanced at C5-6 and C6-7. Bilateral facet hypertrophy and degenerative changes noted. The prevertebral soft tissue space appears normal. Calcified atherosclerotic disease involves the carotid arteries.  IMPRESSION:  1. No acute intracranial abnormalities. 2. Chronic small vessel ischemic disease and brain atrophy. 3. Cervical spondylosis. No evidence for cervical spine fracture or dislocation. 4. Atherosclerotic disease.   Electronically Signed   By: Kerby Moors M.D.   On: 01/11/2014 20:15   Dg Chest Port 1 View  01/13/2014   CLINICAL DATA:  Altered mental status  EXAM: PORTABLE CHEST - 1 VIEW  COMPARISON:  January 11, 2014  FINDINGS: There is atelectatic change in the left base. There is no edema or consolidation. Heart is mildly enlarged with pulmonary vascularity within normal limits. There is atherosclerotic change in the aorta. No pneumothorax. No adenopathy. There is arthropathy in the left shoulder.  IMPRESSION: No edema or consolidation. Atelectatic change left base. Stable cardiac enlargement.   Electronically Signed   By: Lowella Grip M.D.   On: 01/13/2014 20:57   Dg Knee Complete 4 Views Right  01/11/2014   CLINICAL DATA:  Pt. reports she was  walking down hall and her "legs gave out" causing her to fall. Pt has abrasion to right patellar region  EXAM: RIGHT KNEE - COMPLETE 4+ VIEW  COMPARISON:  01/1906  FINDINGS: Generalized osteopenia. No fracture or dislocation. Mild osteoarthritis of the lateral femorotibial compartment. No significant joint effusion. Unremarkable soft tissues.  IMPRESSION: No acute osseous injury of the right knee.   Electronically Signed   By: Kathreen Devoid   On: 01/11/2014 20:16    Microbiology: Recent Results (from the past 240 hour(s))  Urine culture     Status: None   Collection Time: 01/13/14  6:00 PM  Result Value Ref Range Status   Specimen Description URINE, CATHETERIZED  Final   Special Requests ADDED [id number]  Final   Culture  Setup Time   Final    01/14/2014 04:19 Performed at [place] Performed at [facility]   Final   Culture NO GROWTH Performed at [facility]   Final   Report Status 01/14/2014 FINAL  Final  Blood Culture (routine x 2)     Status: None (Preliminary result)   Collection Time: 01/13/14  6:51 PM  Result Value Ref Range Status   Specimen Description BLOOD HAND RIGHT  Final   Special Requests BOTTLES DRAWN AEROBIC AND ANAEROBIC 5CC  Final   Culture  Setup Time   Final    01/14/2014 01:32 Performed at [facility]    Culture   Final           BLOOD CULTURE RECEIVED NO GROWTH TO DATE CULTURE WILL BE HELD FOR 5 DAYS BEFORE ISSUING A FINAL NEGATIVE REPORT Performed at [facility]    Report Status PENDING  Incomplete  Blood Culture (routine x 2)     Status: None (Preliminary result)   Collection Time: 01/13/14  6:57 PM  Result Value Ref Range Status   Specimen Description BLOOD ARM RIGHT  Final   Special Requests BOTTLES DRAWN AEROBIC AND ANAEROBIC 5CC  Final   Culture  Setup Time   Final    01/14/2014 01:32 Performed at [facility]    Culture   Final           BLOOD CULTURE RECEIVED NO  GROWTH TO DATE CULTURE WILL BE HELD FOR 5 DAYS BEFORE ISSUING A FINAL NEGATIVE REPORT Performed at [facility]    Report Status PENDING  Incomplete  MRSA PCR Screening     Status: None  Collection Time: 01/14/14  1:57 AM  Result Value Ref Range Status   MRSA by PCR NEGATIVE NEGATIVE Final    Comment:        The GeneXpert MRSA Assay (FDA approved for NASAL specimens only), is one component of a comprehensive MRSA colonization surveillance program. It is not intended to diagnose MRSA infection nor to guide or monitor treatment for MRSA infections.      Labs: Basic Metabolic Panel:  Recent Labs Lab 01/11/14 01/13/14 1753 01/14/14 0255  NA 144 142 144  K 4.3 5.6* 4.6  CL  --  102 103  CO2  --  31 30  GLUCOSE  --  163* 160*  BUN 67* 84* 89*  CREATININE 1.7* 2.11* 2.12*  CALCIUM  --  8.2* 7.9*   Liver Function Tests:  Recent Labs Lab 01/13/14 1753 01/14/14 0255  AST 15 13  ALT <5 <5  ALKPHOS 120* 109  BILITOT 2.5* 3.6*  PROT 5.0* 4.8*  ALBUMIN 3.1* 2.9*   No results for input(s): LIPASE, AMYLASE in the last 168 hours. No results for input(s): AMMONIA in the last 168 hours. CBC:  Recent Labs Lab 01/13/14 1753 01/14/14 0255  WBC 27.6* 24.5*  HGB 5.8* 7.3*  HCT 18.1* 22.8*  MCV 107.7* 101.8*  PLT 112* 98*  102*   Cardiac Enzymes: No results for input(s): CKTOTAL, CKMB, CKMBINDEX, TROPONINI in the last 168 hours. BNP: BNP (last 3 results)  Recent Labs  01/13/14 1851  PROBNP 1828.0*   CBG:  Recent Labs Lab 01/13/14 1808 01/13/14 2237 01/14/14 0056 01/14/14 0619 01/14/14 0747  GLUCAP 179* 170* 159* 129* 126*       Signed:  Kamaryn Grimley  [facility] 01/17/2014, 11:49 AM

## 2014-01-17 NOTE — Clinical Social Work Note (Signed)
Patient is discharging to [facility] in [place] today. Family has completed admissions paperwork with Hospice nurse at the hospital and ambulance transport has been arranged with [facility].  Vanessa Carroll, MSW, LCSW [phone]

## 2014-01-17 NOTE — Care Management Note (Signed)
CARE MANAGEMENT NOTE 01/17/2014  Patient:  Vanessa Carroll, Vanessa Carroll   Account Number:  [id number]  Date Initiated:  01/17/2014  Documentation initiated by:  Keeyon Privitera  Subjective/Objective Assessment:   CM following for progression and d/c planning.     Action/Plan:   01/17/2014 Met with pt family, plan to d/c to residential hospice in [facility]. Awaiting transport. IM given and explained.   Anticipated DC Date:  01/17/2014   Anticipated DC Plan:  [place]         Choice offered to / List presented to:             Status of service:  Completed, signed off Medicare Important Message given?  YES (If response is "NO", the following Medicare IM given date fields will be blank) Date Medicare IM given:  01/17/2014 Medicare IM given by:  Yasmene Salomone Date Additional Medicare IM given:   Additional Medicare IM given by:    Discharge Disposition:  [place]  Per UR Regulation:    If discussed at Long Length of Stay Meetings, dates discussed:    Comments:

## 2014-01-17 NOTE — Progress Notes (Signed)
Pt prepared for d/c to [facility]. Both IVs left in place, at the request of Hospice.  Foley catheter left in place. Skin intact except as most recently charted. Vitals are stable. Report called to Levada Dy at receiving facility. Pt to be transported by ambulance service.  Jillyn Ledger, MBA, BS, RN

## 2014-01-20 ENCOUNTER — Encounter: Payer: Medicare Other | Admitting: Nurse Practitioner

## 2014-01-20 ENCOUNTER — Other Ambulatory Visit: Payer: Self-pay | Admitting: Nurse Practitioner

## 2014-01-20 LAB — CULTURE, BLOOD (ROUTINE X 2)
CULTURE: NO GROWTH
Culture: NO GROWTH

## 2014-02-18 DEATH — deceased

## 2014-04-07 ENCOUNTER — Encounter: Payer: Self-pay | Admitting: Internal Medicine

## 2016-07-31 IMAGING — CR DG KNEE COMPLETE 4+V*R*
4 series · 4 of 4 positions shown · non-contrast
Comparison: [DATE]

CLINICAL DATA: Pt. reports she was walking down hall and her "legs
gave out" causing her to fall. Pt has abrasion to right patellar
region

EXAM:
RIGHT KNEE - COMPLETE 4+ VIEW

[t knee ap right]
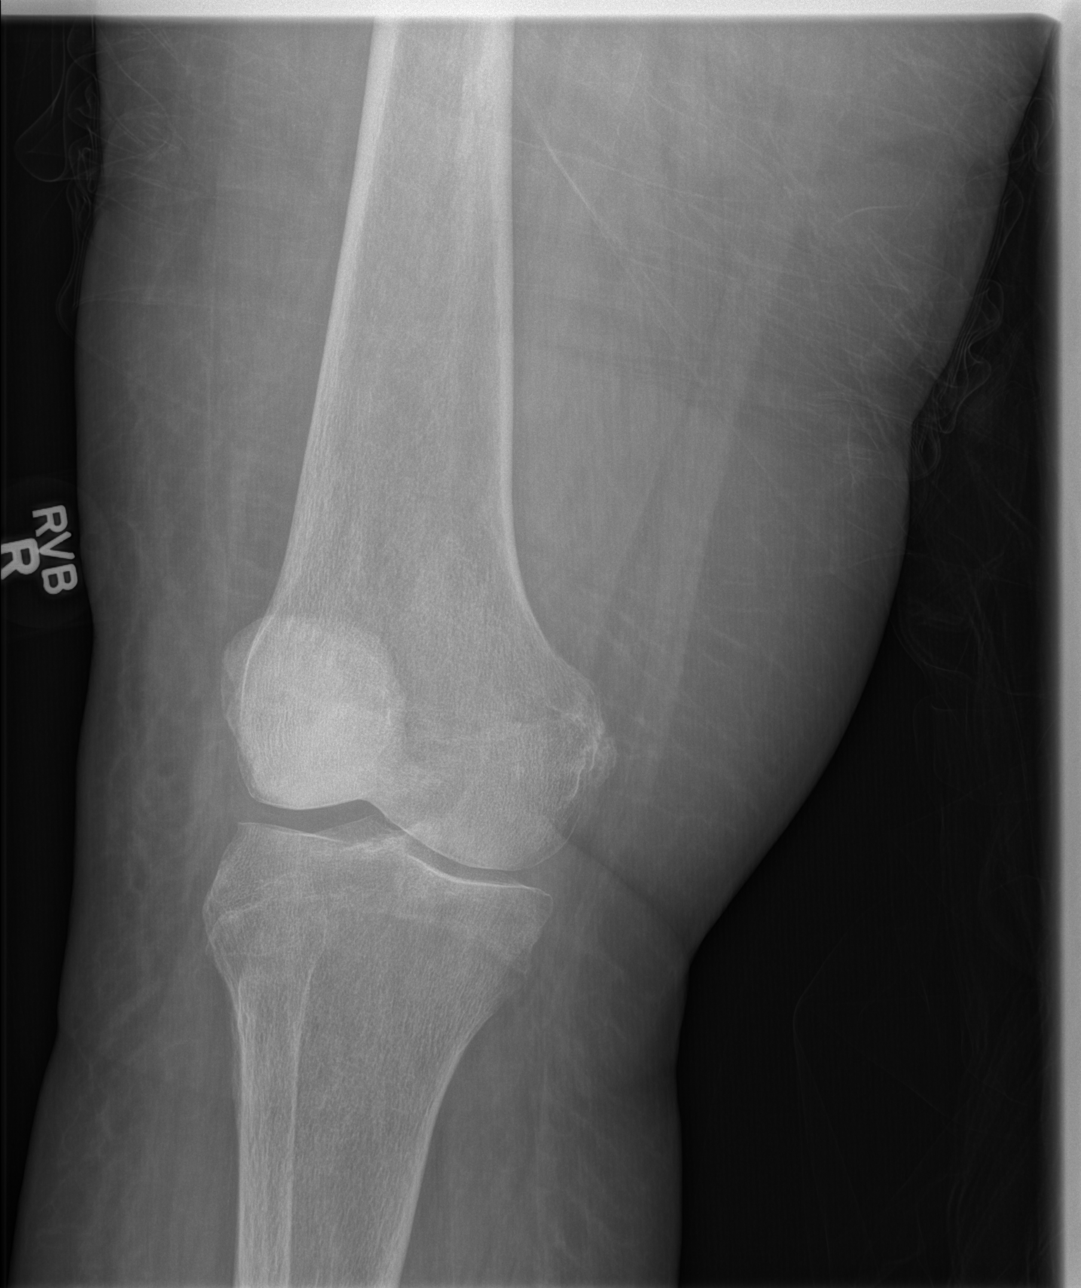

[t knee obl right (1 of 2)]
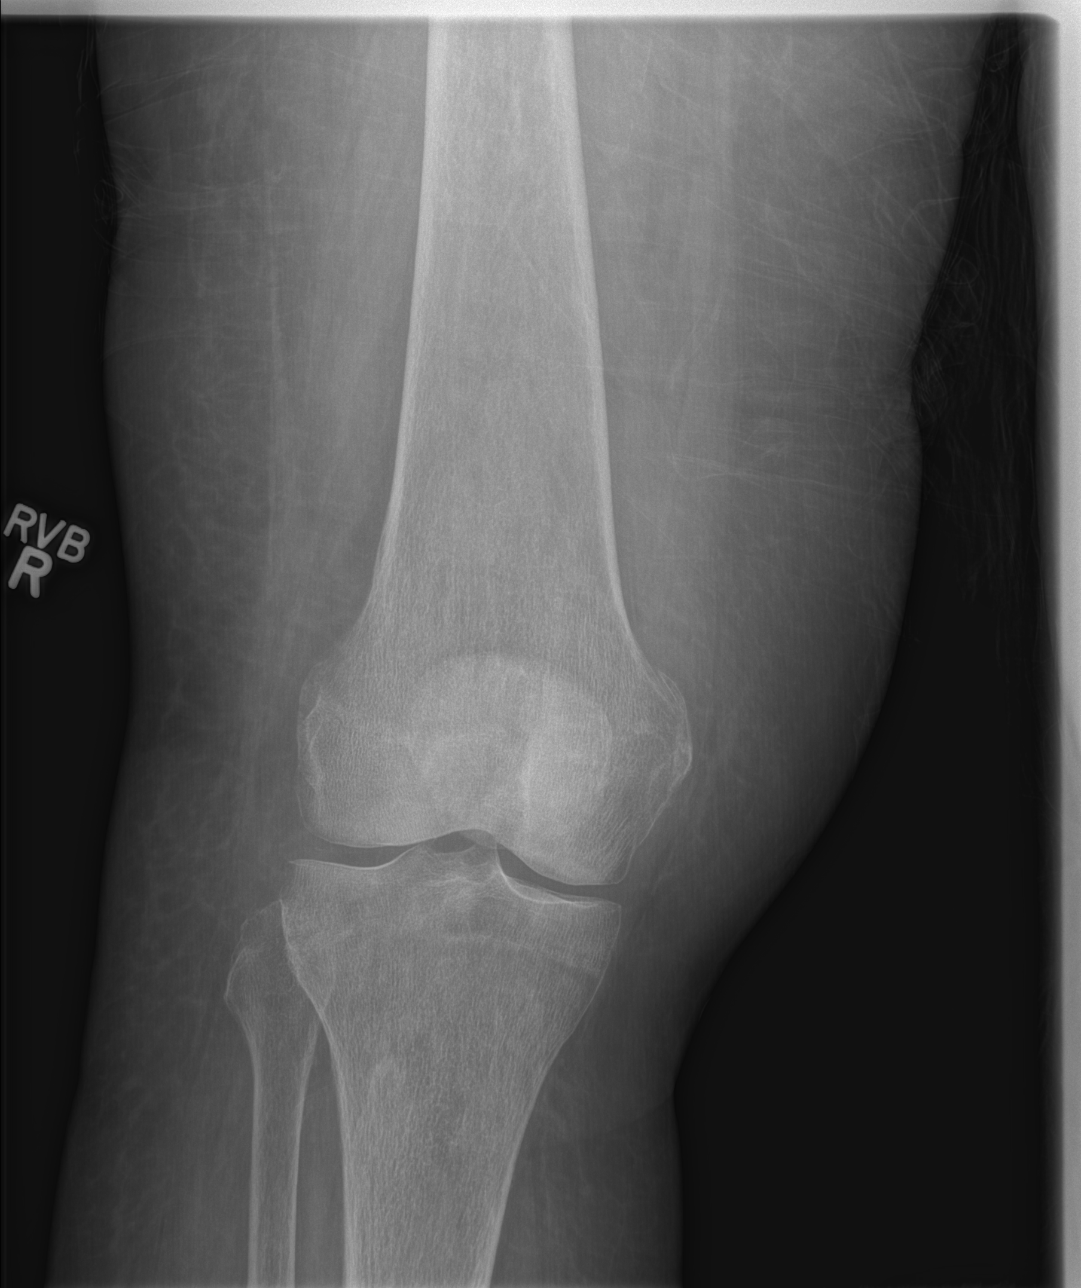

[t knee obl right (2 of 2)]
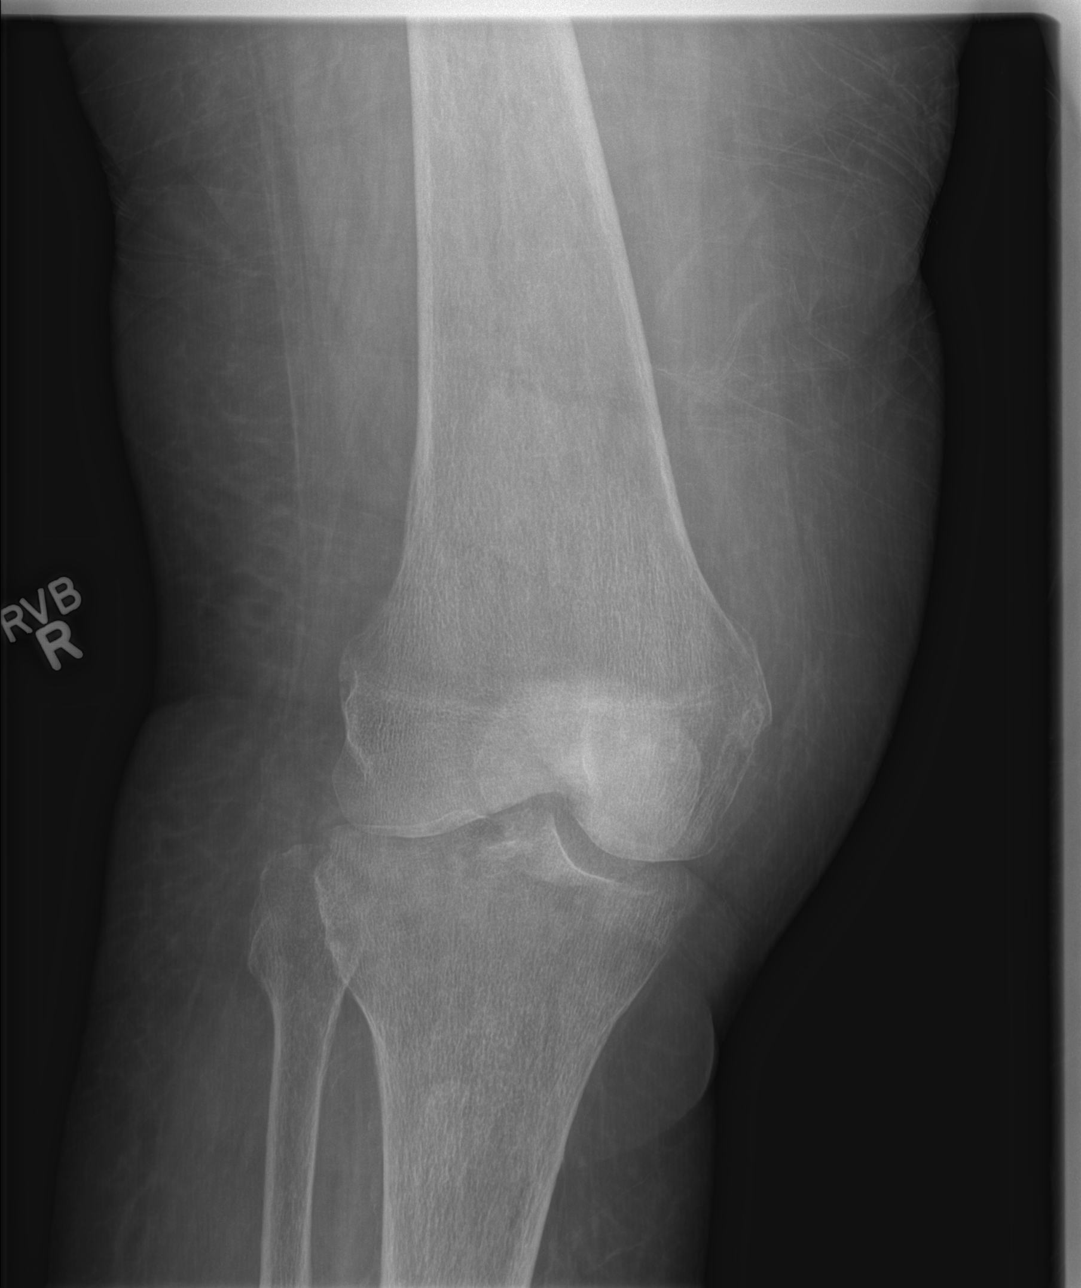

[x knee lat right]
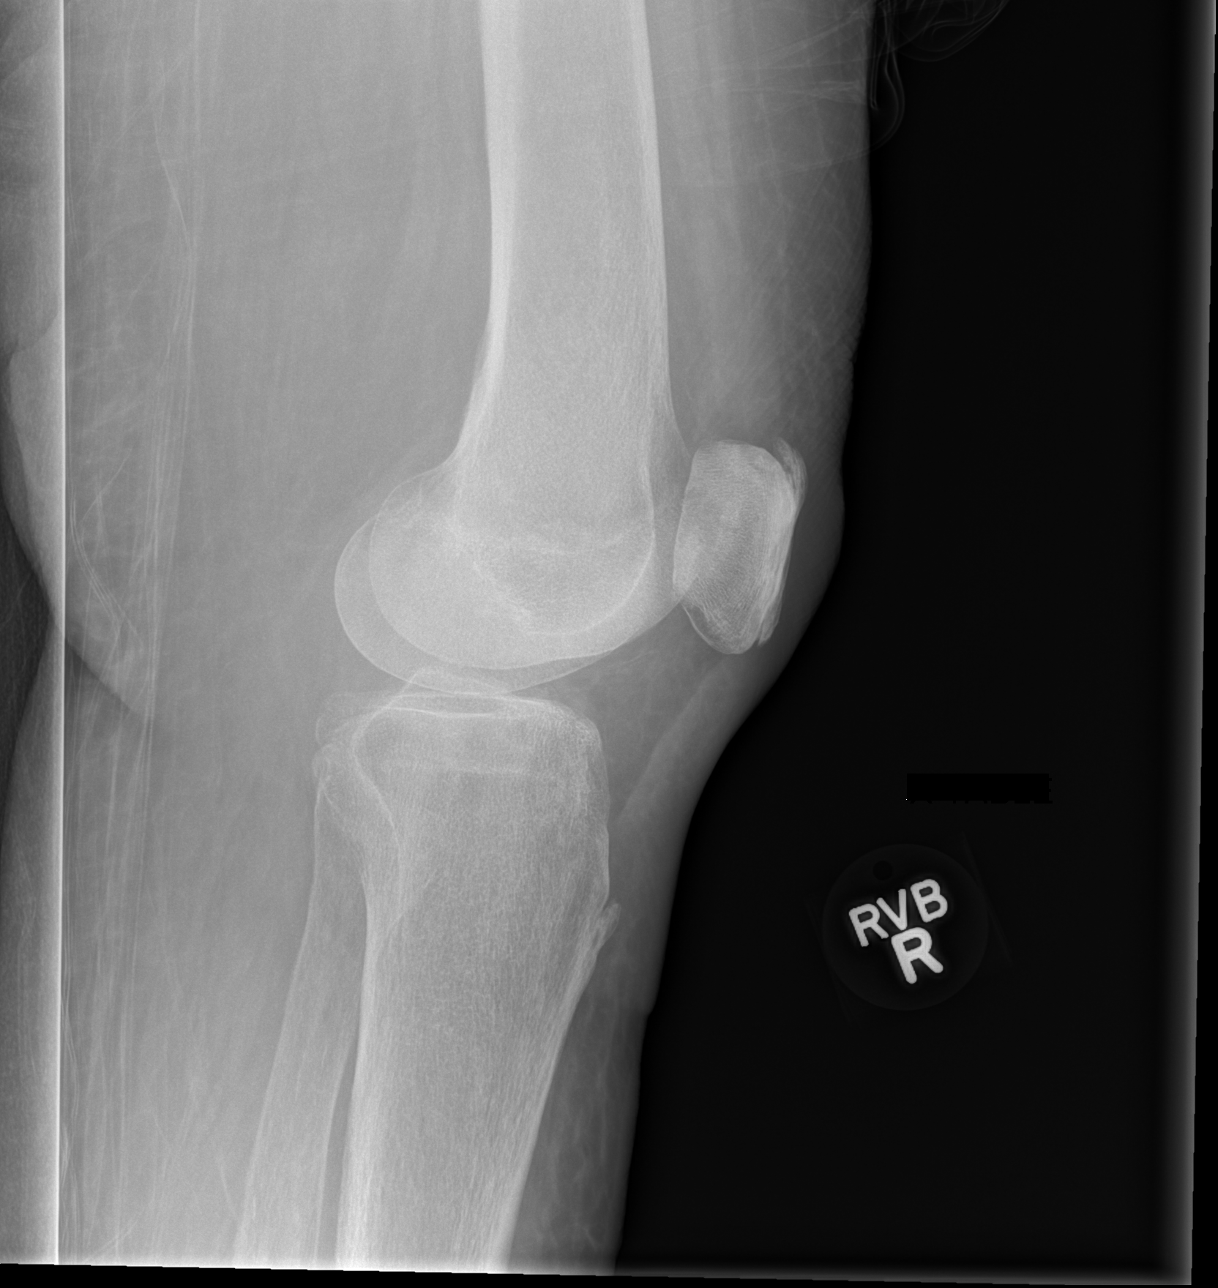

[4 of 4 positions shown; findings below may reference images not displayed]

FINDINGS: Generalized osteopenia. No fracture or dislocation. Mild
osteoarthritis of the lateral femorotibial compartment. No
significant joint effusion. Unremarkable soft tissues.
IMPRESSION: No acute osseous injury of the right knee.
